# Patient Record
Sex: Male | Born: 1985 | Race: White | Hispanic: No | Marital: Married | Smoking: Never smoker
Health system: Southern US, Community
[De-identification: ages and names within clinical notes are randomized; demographics above are authoritative.]

## PROBLEM LIST (undated history)

## (undated) HISTORY — PX: HAND SURGERY: SHX662

## (undated) HISTORY — PX: APPENDECTOMY: SHX54

## (undated) HISTORY — PX: WISDOM TOOTH EXTRACTION: SHX21

## (undated) HISTORY — PX: MANDIBLE FRACTURE SURGERY: SHX706

---
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
## 2007-03-08 ENCOUNTER — Emergency Department (HOSPITAL_COMMUNITY): Admission: EM | Admit: 2007-03-08 | Discharge: 2007-03-09 | Payer: Self-pay | Admitting: Emergency Medicine

## 2011-02-25 NOTE — Op Note (Signed)
[facility]  Patient:    Earl [name], Earl [name] Visit Number: [id number] MRN: [id number]          Service Type: SUR Location: 4W [id number] [id number] Attending Physician:  Luis Abed Dictated by:   Dionne Ano. Gwyneth Sprout., D.D.S. Proc. Date: 09/03/01 Admit Date:  09/03/2001 Discharge Date: 09/04/2001                             Operative Report  PREOPERATIVE DIAGNOSES: 1. 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 2. 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 3. Mandibular transverse deficiency. 4. Maxillary transverse deficiency. 5. Mandibular asymmetry.  POSTOPERATIVE DIAGNOSES: 1. 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 2. 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 3. Mandibular transverse deficiency. 4. Maxillary transverse deficiency. 5. Mandibular asymmetry.  PROCEDURE: 1. LeFort I maxillary osteotomy with segmentalization and advancement. 2. Bilateral sagittal split osteotomy of the mandible with posterior repositioning and midline correction with rigid fixation.  SURGEON:  Dionne Ano. Gwyneth Sprout., D.D.S.  ASSISTANT:  Saddie Benders, D.D.S.  INDICATION FOR PROCEDURE:  This 25 year old male has been under orthodontic treatment with Dr. Randall An for a number of years for correction and alignment of the maxillary and mandibular dentition and is followed for a severe skeletal growth abnormality. He shows 9 mm of reverse overjut, and his occlusion cannot be corrected with orthodontic treatment. Complete preoperative x-rays, cephalometric analysis, model analysis, video imaging analysis, also all indicated severe discrepancy between the maxilla and mandible. Surgical models were completed and simulated surgery was completed on the models and surgical splints were fabricated prior to the surgery.  PROCEDURE AND FINDINGS:  The patient was brought to the operating suite  and placed in the supine position on the operating table. After satisfactory nasotracheal anesthesia, a warm moist sponge was placed in the oropharynx and the oral cavity and face were prepped with chlorhexidine scrub and rinse. The patient was then draped with four towels, a split sheet and a half sheet.  Prior to this, a Foley catheter was also inserted.  Using 0.5% Marcaine with 1:200,000 epinephrine, bilateral inferior alveolar and lingual nerve blocks were achieved for postoperative comfort and intraoperative hemostasis, and a mouth prop was placed on the left side. Using electrocautery, an incision was made along the external oblique ridge of the right ramus of the mandible extended to the second molar tooth and full thickness mucoperiosteal flap was elevated laterally exposing the anterior border of the ramus of the mandible and the internal oblique ridge. The lingula, using a rotary osteotome Lindemann burr, a horizontal band cut was made just superior to the lingula on the medial aspect of the mandible. A sagittal band cut was completed with a 701 burr and a bone cut through the inferior border and the lateral cortical plane of the mandible superiorly to connect with the sagittal cut in the modified _______ technique.  No attempt was made to complete the sagittal split at the present time and the mandible was left intact. A similar procedure was carried out on the left ramus of the mandible.  Turning to the maxilla, using  0.5% Marcaine with 1:200,000 epinephrine, bilateral posterior superior alveolar nerve blocks, inferior alveolar nerve blocks, and infraorbital nerve block, nasopalatine and greater palatine nerve blocks were achieved. Using electrocautery, an incision was made approximately 5 mm superior to the junction of the attached and unattached gingiva in the right maxillary buccal vestibule extending  from the tuberosity anteriorly to the midline. The incision was  carried through the mucosa, submucosal buccinator muscle, periosteum down to bone. The _______ periosteal dissection was carried out superiorly through the infraorbital nerve and piriform ridge of the nose and zygomatic buttress. A tunneling procedure was used on the posterior border of the maxilla to the pterygoid plates. A similar procedure was used on the left side.  Marking holes were placed in the lateral wall of the maxilla and both through the zygomatic buttress and the piriform rim area exactly 10 mm apart. Using a sagittal saw, a horizontal bone cut was made through the maxilla approximately 1 cm superior to the apices of the teeth from the zygomatic buttress to the piriform rim of the nose and then along the posterior border to the pterygoid plates. A mallet and sharp chisel were used to section the medial wall of the maxilla posteriorly to the cortical plates and a nasoseptal chisel was used to remove the nasal septum and vomer bone from the maxilla.  A pterygomaxillary chisel was used to section the maxilla from the pterygoid plate and the maxilla could easily be down-fractured at this point. This gave good access to the posterior wall of the maxilla and sharp bony protuberances were removed. The bone was smooth and being that the mandible was being advanced approximately 6 mm on the right side and 7 mm on the left side, no attempt was made to reduce any other bone structures. Both maxillary sinuses were clear. The maxilla was mobilized using Tessier disimpaction instruments and the maxilla could easily be advanced.  Care was taken to remove approximately 2 mm of the nasal septum in the anterior portion and the nasal mucosa was sutured closed. There was no tearing of the nasal mucosa from the down fracture.  The maxilla was then segmentalized using a 701 burr and the parasagittal area from the nasal side of the maxilla and extended anteriorly to the midline and a  horseshoe-shaped bone incision was made and 701 burr was used to cut through the lateral cortical plate between the central incisor teeth and a thin sharp  osteotome was used to split the maxilla into two segments. The palatine mucosa was undermined and the maxilla could easily be expanded 5 mm. As planned on the surgical models, the intermediate surgical splint was inserted and the teeth were ligated to the splint, and then the patient was placed in intermaxillary fixation and with the condyle seated in the most superior portion of the glenoid fossa, the maxilla was rotated superiorly and produced approximately 7 mm of advancement of the maxilla. Good bone contact was thus seen on both sides so that the piriform rim area and the zygomatic buttress area and bone plates were adapted using 1.6 mm titanium bone plates and shaped in a hull fashion. Bone plates were then inserted with the maxilla in its proper anatomical position and both the left and right piriform rims and both the left and right zygomatic buttresses. Intermaxillary fixation was removed. The occlusion was checked and it seemed to be exactly as up and on the surgical models, and the mandible rotated easily into the surgical splint.  The  maxillary surgical intermediate splint was removed and a final splint was placed and then attention was then carried out to both the ramus of the mandible. The bone cuts which had previously been made for the sagittal splint and using a thin mallet and chisel and progressing to larger chisels, the right maxillary  ramus sagittal splint was completed in the proximal and distal segments. The inferior alveolar nerve was noted to be located in the distal segment. The pterygomaxillary sling was removed from the inferior border of the mandible and this allowed posterior and rotational repositioning of the mandible. Assembly procedure was carried out on the left ramus of the mandible, also noting the  inferior alveolar nerve to be totally intact but located in the proximal segment and had to be removed and repositioned in the distal segment.  Then with the condyle seated in the most superior position over the glenoid fossa, using a vertical and horizontal directional force, the lateral cortical plate of the proximal segment was clamped to the medial cortical plate of the distal segment and titanium, 2-mm diameter, 12-mm length, screws were placed through the superior border, thus, allowing two-point fixation and stable fixation for the mandible.  The intermaxillary fixation was removed. The patients mandible was rotated in the glenoid fossa and fit exactly into the surgical splint indicating achievement of a Class I occlusive relationship with good midline alignment.  All the incisions were thoroughly irrigated. The maxilla was closed with an alar cinch suture and VY closure in the anterior area to prevent inversion of the lip after healing. Both incisions of the mandible were closed with running horizontal 3-0 Vicryl mattress sutures.  The throat pack was removed. The oropharynx was suctioned dry. The patient was placed in light intermaxillary elastic fixation and allowed to recover in the operating room. He was extubated in the operating room and transported to the recovery room.  The patient tolerated this surgery and the anesthesia without complications with an estimated blood loss of less than 200 cc.  COMPLICATIONS:  None. Dictated by:   Dionne Ano. Gwyneth Sprout., D.D.S. Attending Physician:  Luis Abed DD:  09/03/01 TD:  09/04/01 Job: [id number]

## 2011-02-25 NOTE — Discharge Summary (Signed)
[facility]  Patient:    Earl [name], Earl [name] Visit Number: [id number] MRN: [id number]          Service Type: Attending:  Dionne Ano. Gwyneth Sprout., D.D.S. Dictated by:   Dionne Ano. Gwyneth Sprout., D.D.S. Adm. Date:  09/03/01 Disc. Date: 09/04/01                             Discharge Summary  This 25 year old male was admitted to [facility] with a primary diagnosis of maxillary sagittal deficiency, maxillary transverse deficiency, mandibular sagittal excess with class III malocclusion, and mandibular asymmetry, and was admitted for surgical correction of these facial bony abnormalities.  CHIEF COMPLAINT:  He could not chew food well, and he has a large underbite.  HISTORY OF PRESENT ILLNESS:  This 25 year old male has been under orthodontic care for approximately 4 years with Dr. Lorin Picket ______ for alignment of the maxillary mandibular teeth, and following his facial skeletal growth pattern. Cephametric x-ray, ______ analysis, clinical analysis, video imaging was all completed on this patient showing that he had significant maxillary transverse deficiency, maxillary sagittal deficiency, and mandibular sagittal excess with asymmetry.  Correction of his malocclusion was impossible with ______ means, and the teeth were aligned, and each individual arch in their proper anatomical position for surgical repositioning of the maxilla.  Surgical treatment plan was formulated on surgical models with template guides for correction of these facial abnormalities, and the patient was admitted for the surgical procedure.  PAST MEDICAL HISTORY:  Unremarkable.  ADMISSION LABORATORY DATA:  Hemoglobin was 14.7, hematocrit 43.7, normal indices.  He was typed as type O, Rh factor was positive, and antibody screen was negative.  SURGERY:  The patient was taken to surgery on the day of admission, and under general nasotracheal anesthesia a segmental two piece LaFort I  maxillary osteotomy with advancement and transverse aligning with rigid fixation was completed, along with a bilateral sagittal split osteotomy of the mandible with posterior repositioning and rotational correction of the asymmetry with rigid fixation.  The patient tolerated this surgery and the anesthesia without complications, with an estimated blood loss of approximately 200 cc.  HOSPITAL COURSE:  The first postoperative day was marked by decrease of oral intake, and the patient was maintained on intravenous fluids, and he is now taking oral fluids well, and is ready for discharge.  He has moderate facial swelling and no airway obstruction, and home care instructions have been reviewed with the patient and his father, including instructions on dietary intake for a full liquid and mechanical soft diet.  How to remove the maxillary mandibular fixation should emesis and airway obstruction occur. Oral hygiene.  DISCHARGE MEDICATIONS: 1. Keflex 500 mg one tablet q.i.d. 2. Decadron 4 mg one tablet t.i.d. x 3 days. 3. Percocet one tablet q.4h. p.r.n. pain.  FINAL DIAGNOSES: 1. 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 2. Maxillary transverse deficiency. 3. 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 4. 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 Dictated by:   Dionne Ano. Gwyneth Sprout., D.D.S. Attending:  Dionne Ano. Gwyneth Sprout., D.D.S. DD:  09/04/01 TD:  09/04/01 Job: [id number]

## 2012-05-22 ENCOUNTER — Emergency Department (HOSPITAL_COMMUNITY)
Admission: EM | Admit: 2012-05-22 | Discharge: 2012-05-22 | Disposition: A | Discharge status: Home / Self Care | Payer: Managed Care, Other (non HMO) | Attending: Emergency Medicine | Admitting: Emergency Medicine

## 2012-05-22 ENCOUNTER — Encounter (HOSPITAL_COMMUNITY): Payer: Self-pay

## 2012-05-22 DIAGNOSIS — R509 Fever, unspecified: Secondary | ICD-10-CM | POA: Insufficient documentation

## 2012-05-22 DIAGNOSIS — R42 Dizziness and giddiness: Secondary | ICD-10-CM | POA: Insufficient documentation

## 2012-05-22 DIAGNOSIS — J029 Acute pharyngitis, unspecified: Secondary | ICD-10-CM | POA: Insufficient documentation

## 2012-05-22 LAB — CBC WITH DIFFERENTIAL/PLATELET
Basophils Absolute: 0 10*3/uL (ref 0.0–0.1)
Basophils Relative: 0 % (ref 0–1)
Eosinophils Absolute: 0 10*3/uL (ref 0.0–0.7)
Eosinophils Relative: 0 % (ref 0–5)
HCT: 39.4 % (ref 39.0–52.0)
MCH: 28.9 pg (ref 26.0–34.0)
MCHC: 34 g/dL (ref 30.0–36.0)
MCV: 84.9 fL (ref 78.0–100.0)
Monocytes Absolute: 0.9 10*3/uL (ref 0.1–1.0)
Platelets: 216 10*3/uL (ref 150–400)
RDW: 12.8 % (ref 11.5–15.5)

## 2012-05-22 LAB — MONONUCLEOSIS SCREEN: Mono Screen: NEGATIVE

## 2012-05-22 MED ORDER — DEXAMETHASONE SODIUM PHOSPHATE 10 MG/ML IJ SOLN
10.0000 mg | Freq: Once | INTRAMUSCULAR | Status: AC
  Administered 2012-05-22: 10 mg via INTRAVENOUS
  Filled 2012-05-22: qty 1

## 2012-05-22 MED ORDER — SODIUM CHLORIDE 0.9 % IV SOLN
Freq: Once | INTRAVENOUS | Status: AC
  Administered 2012-05-22: 20:00:00 via INTRAVENOUS

## 2012-05-22 MED ORDER — IBUPROFEN 800 MG PO TABS
800.0000 mg | ORAL_TABLET | Freq: Once | ORAL | Status: AC
Start: 2012-05-22 — End: 2012-05-22
  Administered 2012-05-22: 800 mg via ORAL
  Filled 2012-05-22: qty 1

## 2012-05-22 MED ORDER — PREDNISONE 10 MG PO TABS: 20.0000 mg | ORAL_TABLET | Freq: Two times a day (BID) | ORAL | Status: DC

## 2012-05-22 NOTE — ED Provider Notes (Signed)
History     CSN: [id number]  Arrival date & time 05/22/12  1758   First MD Initiated Contact with Patient 05/22/12 1854      Chief Complaint  Patient presents with  . Fever    (Consider location/radiation/quality/duration/timing/severity/associated sxs/prior treatment) HPI Comments: Patient with several day history of sore throat, fatigue, fever.  Was seen yesterday at [facility] and given amox for presumed strep.  He states that he is feeling worse and is now light headed.  He continues with fever.  Patient is a 26 y.o. male presenting with fever. The history is provided by the patient.  Fever Primary symptoms of the febrile illness include fever and fatigue. The current episode started 3 to 5 days ago. The problem has been gradually worsening.    History reviewed. No pertinent past medical history.  Past Surgical History  Procedure Date  . Mandible fracture surgery   . Hand surgery   . Appendectomy   . Wisdom tooth extraction     No family history on file.  History  Substance Use Topics  . Smoking status: Never Smoker   . Smokeless tobacco: Not on file  . Alcohol Use: No      Review of Systems  Constitutional: Positive for fever and fatigue.  All other systems reviewed and are negative.    Allergies  Review of patient's allergies indicates no known allergies.  Home Medications  No current outpatient prescriptions on file.  BP 133/78  Pulse 98  Temp 100.4 F (38 C)  Resp 20  Ht 6\' 1"  (1.854 m)  Wt 206 lb 11.2 oz (93.759 kg)  BMI 27.27 kg/m2  SpO2 97%  Physical Exam  Nursing note and vitals reviewed. Constitutional: He is oriented to person, place, and time. He appears well-developed and well-nourished. No distress.  HENT:  Head: Normocephalic and atraumatic.  Right Ear: External ear normal.  Left Ear: External ear normal.       Both tonsils are swollen, erythematous, and have exudates.    There is cervical adenopathy present.  Neck: Normal range of  motion. Neck supple.  Cardiovascular: Normal rate and regular rhythm.   No murmur heard. Pulmonary/Chest: Effort normal and breath sounds normal. No respiratory distress.  Abdominal: Soft. Bowel sounds are normal.  Musculoskeletal: Normal range of motion. He exhibits no edema.  Lymphadenopathy:    He has cervical adenopathy.  Neurological: He is alert and oriented to person, place, and time.  Skin: Skin is warm and dry. He is not diaphoretic.    ED Course  Procedures (including critical care time)   Labs Reviewed  CBC WITH DIFFERENTIAL  MONONUCLEOSIS SCREEN  RAPID STREP SCREEN   No results found.   No diagnosis found.    MDM  The patient presents with sore throat, fever, weakness.  He was treated for presumed strep throat with amoxicillin yesterday and has had four doses.  Today's labs are unremarkable.  Both the Strep and Mono tests were unremarkable and the cbc is as well.  He was hydrated with ns and given decadron.  He is feeling better and will be discharged with the instructions to continue the amox and start prednisone.          Geoffery Lyons, MD 05/22/12 2046

## 2012-05-22 NOTE — ED Notes (Signed)
Pt reports was diagnosed with strep throat yesterday and was started on antibiotics.  Pt reports fever got up to 103 today and pt c/o feeling dizzy and nauseated.

## 2013-01-01 ENCOUNTER — Ambulatory Visit (INDEPENDENT_AMBULATORY_CARE_PROVIDER_SITE_OTHER): Payer: Managed Care, Other (non HMO) | Admitting: Gastroenterology

## 2013-01-01 ENCOUNTER — Encounter: Payer: Self-pay | Admitting: Gastroenterology

## 2013-01-01 ENCOUNTER — Other Ambulatory Visit: Payer: Self-pay | Admitting: Internal Medicine

## 2013-01-01 VITALS — BP 128/77 | HR 75 | Temp 97.6°F | Ht 73.0 in | Wt 213.4 lb

## 2013-01-01 DIAGNOSIS — K6289 Other specified diseases of anus and rectum: Secondary | ICD-10-CM

## 2013-01-01 DIAGNOSIS — R198 Other specified symptoms and signs involving the digestive system and abdomen: Secondary | ICD-10-CM

## 2013-01-01 DIAGNOSIS — K625 Hemorrhage of anus and rectum: Secondary | ICD-10-CM

## 2013-01-01 DIAGNOSIS — R194 Change in bowel habit: Secondary | ICD-10-CM

## 2013-01-01 MED ORDER — PEG 3350-KCL-NA BICARB-NACL 420 G PO SOLR: 4000.0000 mL | ORAL | Status: DC

## 2013-01-01 NOTE — Assessment & Plan Note (Signed)
Rectal bleeding of 4 months duration, associated with change in bowel habits, rectal pain. Patient states that bleeding is large volume consisting of both dark and bright red blood per rectum. Denies melena. Denies NSAID or aspirin use. Stool is thinner than usual and he hass developed postprandial urgency. He may have bleeding from anorectal fissure but we need to exclude inflammatory bowel disease given change in bowel habits. He has been advised to keep the stool soft and if needed he can add MiraLax 17 g daily. He'll complete his current regimen of Anusol suppositories. If he has recurrent pain he is to let me know and we will refill his prescription. Colonoscopy in the near future.  I have discussed the risks, alternatives, benefits with regards to but not limited to the risk of reaction to medication, bleeding, infection, perforation and the patient is agreeable to proceed. Written consent to be obtained.

## 2013-01-01 NOTE — Patient Instructions (Signed)
We have scheduled you for a colonoscopy with Dr. Jena Gauss to further evaluate your symptoms. Please see separate instructions.  Continue stool softener daily to maintain soft stools. If you have recurrent rectal pain, please call and let me know so we can refill your Anusol suppositories.

## 2013-01-01 NOTE — Progress Notes (Signed)
Primary Care Physician:  FAGAN,ROY, MD  Primary Gastroenterologist:  Michael Rourk, MD   Chief Complaint  Patient presents with  . Rectal Bleeding  . Rectal Pain    HPI:  Earl [name] is a 26 y.o. male here for further evaluation of several episodes of rectal bleeding. First episode occurred around November of last year. He's had recurrent bleeding in the last few weeks. Each time he has dark and bright red blood, described as large volume. Initially he did not have any rectal pain associated with this but as the episodes progressed he had more pain. Really denies any significant constipation. He generally would have one bowel movement daily up until more recently when he's noticed postprandial fecal urgency. His stool has become thin. Generally has a couple of bowel movements in the late afternoon and early evening. On rectal exam by Dr. Fagan he had tenderness at 6:00 but no palpable mass. No gross blood present. Hemoccult faintly positive. He had a rectal tag.  Some crampy abdominal pain. No heartburn, n/v, dysphagia, weight loss. Started anusol suppositories about 2 weeks ago. Rectal pain has improved but he continues to have bleeding. He is on a stool softener daily and denies hard stool or straining.  No family history of inflammatory bowel disease, colon cancer. Grandfather has history of diverticulitis.    Current Outpatient Prescriptions  Medication Sig Dispense Refill  . ANUSOL-HC 25 MG suppository Place 25 mg rectally 2 (two) times daily.       . docusate sodium (COLACE) 100 MG capsule Take 100 mg by mouth 2 (two) times daily.      . polyethylene glycol-electrolytes (TRILYTE) 420 G solution Take 4,000 mLs by mouth as directed.  4000 mL  0   No current facility-administered medications for this visit.    Allergies as of 01/01/2013  . (No Known Allergies)    History reviewed. No pertinent past medical history.  Past Surgical History  Procedure Laterality Date  .  Mandible fracture surgery    . Hand surgery    . Appendectomy    . Wisdom tooth extraction      Family History  Problem Relation Age of Onset  . Diverticulitis Maternal Grandfather     History   Social History  . Marital Status: Married    Spouse Name: N/A    Number of Children: 1  . Years of Education: N/A   Occupational History  . [employer]     production scheduler   Social History Main Topics  . Smoking status: Never Smoker   . Smokeless tobacco: Not on file  . Alcohol Use: Yes     Comment: socially, couple of times per month  . Drug Use: No  . Sexually Active: Not on file   Other Topics Concern  . Not on file   Social History Narrative  . No narrative on file      ROS:  General: Negative for anorexia, weight loss, fever, chills, fatigue, weakness. Eyes: Negative for vision changes.  ENT: Negative for hoarseness, difficulty swallowing , nasal congestion. CV: Negative for chest pain, angina, palpitations, dyspnea on exertion, peripheral edema.  Respiratory: Negative for dyspnea at rest, dyspnea on exertion, cough, sputum, wheezing.  GI: See history of present illness. GU:  Negative for dysuria, hematuria, urinary incontinence, urinary frequency, nocturnal urination.  MS: Negative for joint pain, low back pain.  Derm: Negative for rash or itching.  Neuro: Negative for weakness, abnormal sensation, seizure, frequent headaches, memory loss, confusion.    Psych: Negative for anxiety, depression, suicidal ideation, hallucinations.  Endo: Negative for unusual weight change.  Heme: Negative for bruising or bleeding. Allergy: Negative for rash or hives.    Physical Examination:  BP 128/77  Pulse 75  Temp(Src) 97.6 F (36.4 C) (Oral)  Ht 6' 1" (1.854 m)  Wt 213 lb 6.4 oz (96.798 kg)  BMI 28.16 kg/m2   General: Well-nourished, well-developed in no acute distress.  Head: Normocephalic, atraumatic.   Eyes: Conjunctiva pink, no icterus. Mouth: Oropharyngeal  mucosa moist and pink , no lesions erythema or exudate. Neck: Supple without thyromegaly, masses, or lymphadenopathy.  Lungs: Clear to auscultation bilaterally.  Heart: Regular rate and rhythm, no murmurs rubs or gallops.  Abdomen: Bowel sounds are normal, nontender, nondistended, no hepatosplenomegaly or masses, no abdominal bruits or hernia , no rebound or guarding.   Rectal: Deferred. Extremities: No lower extremity edema. No clubbing or deformities.  Neuro: Alert and oriented x 4 , grossly normal neurologically.  Skin: Warm and dry, no rash or jaundice.   Psych: Alert and cooperative, normal mood and affect.  Labs: Patient reports that recent CBC with Dr. Fagan was normal.  Imaging Studies: No results found.    

## 2013-01-01 NOTE — Progress Notes (Signed)
Faxed to PCP

## 2013-01-02 ENCOUNTER — Encounter (HOSPITAL_COMMUNITY): Payer: Self-pay | Admitting: Pharmacy Technician

## 2013-01-09 ENCOUNTER — Ambulatory Visit (HOSPITAL_COMMUNITY)
Admission: RE | Admit: 2013-01-09 | Discharge: 2013-01-09 | Disposition: A | Discharge status: Home / Self Care | Payer: Managed Care, Other (non HMO) | Source: Ambulatory Visit | Attending: Internal Medicine | Admitting: Internal Medicine

## 2013-01-09 ENCOUNTER — Encounter (HOSPITAL_COMMUNITY)
Admission: RE | Disposition: A | Discharge status: Home / Self Care | Payer: Self-pay | Source: Ambulatory Visit | Attending: Internal Medicine

## 2013-01-09 ENCOUNTER — Encounter (HOSPITAL_COMMUNITY): Payer: Self-pay | Admitting: *Deleted

## 2013-01-09 DIAGNOSIS — K6289 Other specified diseases of anus and rectum: Secondary | ICD-10-CM

## 2013-01-09 DIAGNOSIS — K648 Other hemorrhoids: Secondary | ICD-10-CM | POA: Insufficient documentation

## 2013-01-09 DIAGNOSIS — K625 Hemorrhage of anus and rectum: Secondary | ICD-10-CM

## 2013-01-09 DIAGNOSIS — R194 Change in bowel habit: Secondary | ICD-10-CM

## 2013-01-09 HISTORY — PX: COLONOSCOPY: SHX5424

## 2013-01-09 SURGERY — COLONOSCOPY
Anesthesia: Moderate Sedation

## 2013-01-09 MED ORDER — MEPERIDINE HCL 100 MG/ML IJ SOLN
INTRAMUSCULAR | Status: AC
  Filled 2013-01-09: qty 2

## 2013-01-09 MED ORDER — MIDAZOLAM HCL 5 MG/5ML IJ SOLN
INTRAMUSCULAR | Status: AC
  Filled 2013-01-09: qty 10

## 2013-01-09 MED ORDER — ONDANSETRON HCL 4 MG/2ML IJ SOLN
INTRAMUSCULAR | Status: AC
  Filled 2013-01-09: qty 2

## 2013-01-09 MED ORDER — MIDAZOLAM HCL 5 MG/5ML IJ SOLN
INTRAMUSCULAR | Status: DC | PRN
  Administered 2013-01-09 (×3): 2 mg via INTRAVENOUS
  Administered 2013-01-09: 1 mg via INTRAVENOUS

## 2013-01-09 MED ORDER — MEPERIDINE HCL 100 MG/ML IJ SOLN
INTRAMUSCULAR | Status: DC | PRN
  Administered 2013-01-09 (×3): 50 mg via INTRAVENOUS

## 2013-01-09 MED ORDER — STERILE WATER FOR IRRIGATION IR SOLN
Status: DC | PRN
  Administered 2013-01-09: 12:00:00

## 2013-01-09 MED ORDER — SODIUM CHLORIDE 0.9 % IV SOLN
INTRAVENOUS | Status: DC
  Administered 2013-01-09: 1000 mL via INTRAVENOUS

## 2013-01-09 MED ORDER — ONDANSETRON HCL 4 MG/2ML IJ SOLN
INTRAMUSCULAR | Status: DC | PRN
  Administered 2013-01-09: 4 mg via INTRAVENOUS

## 2013-01-09 NOTE — Op Note (Signed)
[facility] [address] [place], [address]   COLONOSCOPY PROCEDURE REPORT  PATIENT: [name], Earl T.  MR#:         [id number] BIRTHDATE: 21-Nov-1985 , 26  yrs. old GENDER: Male ENDOSCOPIST: R.  Roetta Sessions, MD FACP FACG REFERRED BY:  Carylon Perches, M.D. PROCEDURE DATE:  01/09/2013 PROCEDURE:     Ileocolonoscopy-diagnostic  INDICATIONS: Rectal pain and bleeding  INFORMED CONSENT:  The risks, benefits, alternatives and imponderables including but not limited to bleeding, perforation as well as the possibility of a missed lesion have been reviewed.  The potential for biopsy, lesion removal, etc. have also been discussed.  Questions have been answered.  All parties agreeable. Please see the history and physical in the medical record for more information.  MEDICATIONS: Versed 7 mg IV and Demerol 150 mg IV in divided doses. Zofran 4 mg IV  DESCRIPTION OF PROCEDURE:  After a digital rectal exam was performed, the EC-[id number] ([id number])  colonoscope was advanced from the anus through the rectum and colon to the area of the cecum, ileocecal valve and appendiceal orifice.  The cecum was deeply intubated.  These structures were well-seen and photographed for the record.  From the level of the cecum and ileocecal valve, the scope was slowly and cautiously withdrawn.  The mucosal surfaces were carefully surveyed utilizing scope tip deflection to facilitate fold flattening as needed.  The scope was pulled down into the rectum where a thorough examination including retroflexion was performed.    FINDINGS:  Adequate preparation. Single anal papilla and minimal internal hemorrhoids; otherwise, normal rectal mucosa. Normal-appearing colonic mucosa. Normal distal 10 cm of terminal ileal mucosa.  THERAPEUTIC / DIAGNOSTIC MANEUVERS PERFORMED:  None  COMPLICATIONS: none  CECAL WITHDRAWAL TIME:  8 minutes  IMPRESSION:  Minimal internal hemorrhoids and anal  papilla/normal rectum, colon and terminal ileum. I suspect ano-rectal bleeding and proctalgia secondary to an occult anal fissure.  RECOMMENDATIONS: Benefiber 2 tablespoons daily. Continue Colace 100 mg orally twice daily.   _______________________________ eSigned:  R. Roetta Sessions, MD FACP [facility] 01/09/2013 12:37 PM   CC:

## 2013-01-09 NOTE — H&P (View-Only) (Signed)
Primary Care Physician:  Carylon Perches, MD  Primary Gastroenterologist:  Roetta Sessions, MD   Chief Complaint  Patient presents with  . Rectal Bleeding  . Rectal Pain    HPI:  Earl [name] is a 27 y.o. male here for further evaluation of several episodes of rectal bleeding. First episode occurred around November of last year. He's had recurrent bleeding in the last few weeks. Each time he has dark and bright red blood, described as large volume. Initially he did not have any rectal pain associated with this but as the episodes progressed he had more pain. Really denies any significant constipation. He generally would have one bowel movement daily up until more recently when he's noticed postprandial fecal urgency. His stool has become thin. Generally has a couple of bowel movements in the late afternoon and early evening. On rectal exam by Dr. Ouida Sills he had tenderness at 6:00 but no palpable mass. No gross blood present. Hemoccult faintly positive. He had a rectal tag.  Some crampy abdominal pain. No heartburn, n/v, dysphagia, weight loss. Started anusol suppositories about 2 weeks ago. Rectal pain has improved but he continues to have bleeding. He is on a stool softener daily and denies hard stool or straining.  No family history of inflammatory bowel disease, colon cancer. Grandfather has history of diverticulitis.    Current Outpatient Prescriptions  Medication Sig Dispense Refill  . ANUSOL-HC 25 MG suppository Place 25 mg rectally 2 (two) times daily.       Marland Kitchen docusate sodium (COLACE) 100 MG capsule Take 100 mg by mouth 2 (two) times daily.      . polyethylene glycol-electrolytes (TRILYTE) 420 G solution Take 4,000 mLs by mouth as directed.  4000 mL  0   No current facility-administered medications for this visit.    Allergies as of 01/01/2013  . (No Known Allergies)    History reviewed. No pertinent past medical history.  Past Surgical History  Procedure Laterality Date  .  Mandible fracture surgery    . Hand surgery    . Appendectomy    . Wisdom tooth extraction      Family History  Problem Relation Age of Onset  . Diverticulitis Maternal Grandfather     History   Social History  . Marital Status: Married    Spouse Name: N/A    Number of Children: 1  . Years of Education: N/A   Occupational History  . [employer]   Social History Main Topics  . Smoking status: Never Smoker   . Smokeless tobacco: Not on file  . Alcohol Use: Yes     Comment: socially, couple of times per month  . Drug Use: No  . Sexually Active: Not on file   Other Topics Concern  . Not on file   Social History Narrative  . No narrative on file      ROS:  General: Negative for anorexia, weight loss, fever, chills, fatigue, weakness. Eyes: Negative for vision changes.  ENT: Negative for hoarseness, difficulty swallowing , nasal congestion. CV: Negative for chest pain, angina, palpitations, dyspnea on exertion, peripheral edema.  Respiratory: Negative for dyspnea at rest, dyspnea on exertion, cough, sputum, wheezing.  GI: See history of present illness. GU:  Negative for dysuria, hematuria, urinary incontinence, urinary frequency, nocturnal urination.  MS: Negative for joint pain, low back pain.  Derm: Negative for rash or itching.  Neuro: Negative for weakness, abnormal sensation, seizure, frequent headaches, memory loss, confusion.  Psych: Negative for anxiety, depression, suicidal ideation, hallucinations.  Endo: Negative for unusual weight change.  Heme: Negative for bruising or bleeding. Allergy: Negative for rash or hives.    Physical Examination:  BP 128/77  Pulse 75  Temp(Src) 97.6 F (36.4 C) (Oral)  Ht 6\' 1"  (1.854 m)  Wt 213 lb 6.4 oz (96.798 kg)  BMI 28.16 kg/m2   General: Well-nourished, well-developed in no acute distress.  Head: Normocephalic, atraumatic.   Eyes: Conjunctiva pink, no icterus. Mouth: Oropharyngeal  mucosa moist and pink , no lesions erythema or exudate. Neck: Supple without thyromegaly, masses, or lymphadenopathy.  Lungs: Clear to auscultation bilaterally.  Heart: Regular rate and rhythm, no murmurs rubs or gallops.  Abdomen: Bowel sounds are normal, nontender, nondistended, no hepatosplenomegaly or masses, no abdominal bruits or hernia , no rebound or guarding.   Rectal: Deferred. Extremities: No lower extremity edema. No clubbing or deformities.  Neuro: Alert and oriented x 4 , grossly normal neurologically.  Skin: Warm and dry, no rash or jaundice.   Psych: Alert and cooperative, normal mood and affect.  Labs: Patient reports that recent CBC with Dr. Ouida Sills was normal.  Imaging Studies: No results found.

## 2013-01-09 NOTE — Interval H&P Note (Signed)
History and Physical Interval Note:  01/09/2013 11:59 AM  Earl [name]  has presented today for surgery, with the diagnosis of RECTAL BLEEDING, RECTAL PAIN AND CHANGE IN BOWEL HABITS  The various methods of treatment have been discussed with the patient and family. After consideration of risks, benefits and other options for treatment, the patient has consented to  Procedure(s) with comments: COLONOSCOPY (N/A) - 11:30 as a surgical intervention .  The patient's history has been reviewed, patient examined, no change in status, stable for surgery.  I have reviewed the patient's chart and labs.  Questions were answered to the patient's satisfaction.     Eula Listen  Colonoscopy per plan.The risks, benefits, limitations, alternatives and imponderables have been reviewed with the patient. Questions have been answered. All parties are agreeable.

## 2013-01-14 ENCOUNTER — Encounter (HOSPITAL_COMMUNITY): Payer: Self-pay | Admitting: Internal Medicine

## 2013-02-04 ENCOUNTER — Encounter: Payer: Self-pay | Admitting: Internal Medicine

## 2013-02-06 ENCOUNTER — Ambulatory Visit (INDEPENDENT_AMBULATORY_CARE_PROVIDER_SITE_OTHER): Payer: Managed Care, Other (non HMO) | Admitting: Gastroenterology

## 2013-02-06 ENCOUNTER — Encounter: Payer: Self-pay | Admitting: Gastroenterology

## 2013-02-06 VITALS — BP 135/78 | HR 72 | Temp 98.2°F | Ht 73.0 in | Wt 213.0 lb

## 2013-02-06 DIAGNOSIS — K602 Anal fissure, unspecified: Secondary | ICD-10-CM

## 2013-02-06 DIAGNOSIS — K649 Unspecified hemorrhoids: Secondary | ICD-10-CM | POA: Insufficient documentation

## 2013-02-06 NOTE — Progress Notes (Signed)
Cc PCP 

## 2013-02-06 NOTE — Patient Instructions (Addendum)
Please call if you have recurrent symptoms of bleeding or pain.

## 2013-02-06 NOTE — Progress Notes (Signed)
Primary Care Physician: Carylon Perches, MD  Primary Gastroenterologist:  Roetta Sessions, MD   Chief Complaint  Patient presents with  . Follow-up    HPI: Earl [name] is a 27 y.o. male here for followup of recent colonoscopy. Colonoscopy was performed due to history of anorectal pain, bleeding. He was found to have minimal internal hemorrhoids and anal papilla. It was felt that he likely had a cold anorectal fissure to explain his bleeding and pain.  At this time he feels 90% improved. Rarely has any anorectal pain or bleeding. He has been able to manage his bowels with Colace 100 mg twice a day. He is having soft daily stools. No further straining. Denies abdominal pain. He is pleased with his progress.  Current Outpatient Prescriptions  Medication Sig Dispense Refill  . docusate sodium (COLACE) 100 MG capsule Take 100 mg by mouth 2 (two) times daily.       No current facility-administered medications for this visit.    Allergies as of 02/06/2013  . (No Known Allergies)    ROS:  General: Negative for anorexia, weight loss, fever, chills, fatigue, weakness. ENT: Negative for hoarseness, difficulty swallowing , nasal congestion. CV: Negative for chest pain, angina, palpitations, dyspnea on exertion, peripheral edema.  Respiratory: Negative for dyspnea at rest, dyspnea on exertion, cough, sputum, wheezing.  GI: See history of present illness. GU:  Negative for dysuria, hematuria, urinary incontinence, urinary frequency, nocturnal urination.  Endo: Negative for unusual weight change.    Physical Examination:   BP 135/78  Pulse 72  Temp(Src) 98.2 F (36.8 C) (Oral)  Ht 6\' 1"  (1.854 m)  Wt 213 lb (96.616 kg)  BMI 28.11 kg/m2  General: Well-nourished, well-developed in no acute distress.  Eyes: No icterus. Mouth: Oropharyngeal mucosa moist and pink , no lesions erythema or exudate. Lungs: Clear to auscultation bilaterally.  Heart: Regular rate and rhythm, no murmurs rubs or  gallops.  Abdomen: Bowel sounds are normal, nontender, nondistended, no hepatosplenomegaly or masses, no abdominal bruits or hernia , no rebound or guarding.   Extremities: No lower extremity edema. No clubbing or deformities. Neuro: Alert and oriented x 4   Skin: Warm and dry, no jaundice.   Psych: Alert and cooperative, normal mood and affect.

## 2013-02-06 NOTE — Assessment & Plan Note (Signed)
27 year old with recent proctalgia and anorectal bleeding related to likely occult fissure. Also found to have minimal internal hemorrhoids. He is 90% improved. He will continue to maintain soft BMs. If he develops persistent/recurrent anorectal pain would consider trial of Rectiv as need step in management. OV prn.

## 2013-03-30 NOTE — Progress Notes (Signed)
REVIEWED.  

## 2016-10-23 ENCOUNTER — Other Ambulatory Visit (HOSPITAL_BASED_OUTPATIENT_CLINIC_OR_DEPARTMENT_OTHER): Payer: Self-pay

## 2016-10-23 DIAGNOSIS — R0683 Snoring: Secondary | ICD-10-CM

## 2016-10-23 DIAGNOSIS — G4730 Sleep apnea, unspecified: Secondary | ICD-10-CM

## 2016-10-31 ENCOUNTER — Ambulatory Visit: Payer: 59 | Attending: Internal Medicine | Admitting: Neurology

## 2016-10-31 DIAGNOSIS — R0683 Snoring: Secondary | ICD-10-CM | POA: Diagnosis present

## 2016-10-31 DIAGNOSIS — G4761 Periodic limb movement disorder: Secondary | ICD-10-CM | POA: Diagnosis not present

## 2016-10-31 DIAGNOSIS — G4733 Obstructive sleep apnea (adult) (pediatric): Secondary | ICD-10-CM | POA: Insufficient documentation

## 2016-10-31 DIAGNOSIS — G4730 Sleep apnea, unspecified: Secondary | ICD-10-CM

## 2016-11-04 NOTE — Procedures (Signed)
  [facility] Bynum Mccullars A. Gerilyn Pilgrimoonquah, MD     [email or web address]             NOCTURNAL POLYSOMNOGRAPHY   LOCATION: [facility]  Patient Name: Muscatello, [place] Study Date: 10/31/2016 Gender: Male D.O.B: 04/06/1986 Age (years): 30 Referring Provider: Carylon Perchesoy Fagan Height (inches): 73 Interpreting Physician: Beryle BeamsKofi Jomo Forand MD, ABSM Weight (lbs): 238 RPSGT: Alfonso EllisHedrick, Debra BMI: 31 MRN: [id number] Neck Size: 16.50 CLINICAL INFORMATION Sleep Study Type: NPSG  Indication for sleep study: Snoring  Epworth Sleepiness Score: 10  SLEEP STUDY TECHNIQUE As per the AASM Manual for the Scoring of Sleep and Associated Events v2.3 (April 2016) with a hypopnea requiring 4% desaturations.  The channels recorded and monitored were frontal, central and occipital EEG, electrooculogram (EOG), submentalis EMG (chin), nasal and oral airflow, thoracic and abdominal wall motion, anterior tibialis EMG, snore microphone, electrocardiogram, and pulse oximetry.  MEDICATIONS Medications self-administered by patient taken the night of the study : N/A  Current Outpatient Prescriptions:  .  docusate sodium (COLACE) 100 MG capsule, Take 100 mg by mouth 2 (two) times daily., Disp: , Rfl:    SLEEP ARCHITECTURE The study was initiated at 9:37:37 PM and ended at 4:29:26 AM.  Sleep onset time was 77.2 minutes and the sleep efficiency was 77.8%. The total sleep time was 320.6 minutes.  Stage REM latency was 60.0 minutes.  The patient spent 6.55% of the night in stage N1 sleep, 59.11% in stage N2 sleep, 16.25% in stage N3 and 18.09% in REM.  Alpha intrusion was absent.  Supine sleep was 44.20%.  RESPIRATORY PARAMETERS The overall apnea/hypopnea index (AHI) was 17.0 per hour. There were 46 total apneas, including 21 obstructive, 12 central and 13 mixed apneas. There were 45 hypopneas and 0 RERAs.  The AHI during Stage REM sleep was 13.4 per hour.  AHI while supine was 34.3 per hour.  The mean oxygen  saturation was 94.90%. The minimum SpO2 during sleep was 90.00%.  Moderate snoring was noted during this study.  CARDIAC DATA The 2 lead EKG demonstrated sinus rhythm. The mean heart rate was N/A beats per minute. Other EKG findings include: None. LEG MOVEMENT DATA The total PLMS were 46 with a resulting PLMS index of 8.61. Associated arousal with leg movement index was 3.6.  IMPRESSIONS - Moderate obstructive sleep apnea is observed. CPAP titration is suggested.  - Mild periodic limb movements of sleep occurred during the study. No significant associated arousals.   Argie RammingKofi A Henslee Lottman, MD Diplomate, American Board of Sleep Medicine.

## 2017-01-20 ENCOUNTER — Encounter: Payer: Self-pay | Admitting: Pulmonary Disease

## 2017-01-20 ENCOUNTER — Ambulatory Visit (INDEPENDENT_AMBULATORY_CARE_PROVIDER_SITE_OTHER): Payer: 59 | Admitting: Pulmonary Disease

## 2017-01-20 DIAGNOSIS — G4733 Obstructive sleep apnea (adult) (pediatric): Secondary | ICD-10-CM | POA: Diagnosis not present

## 2017-01-20 NOTE — Assessment & Plan Note (Signed)
Schedule a CPAP titration study at [facility]. Based on this we will set you up with a CPAP machine   The pathophysiology of obstructive sleep apnea , it's cardiovascular consequences & modes of treatment including CPAP were discused with the patient in detail & they evidenced understanding.

## 2017-01-20 NOTE — Patient Instructions (Signed)
Schedule a CPAP titration study at [facility]. Based on this we will set you up with a CPAP machine

## 2017-01-20 NOTE — Progress Notes (Signed)
Subjective:    Patient ID: Earl [name], male    DOB: April 04, 1986, 31 y.o.   MRN: [id number]  HPI  Chief Complaint  Patient presents with  . Sleep Consult    for OSA. Pt has already has a SS.    31 year old presents for evaluation of obstructive sleep apnea. He reports trouble staying asleep at night's, frequent nocturnal awakenings and restless sleep. Symptoms have been ongoing for the last 2 years He reports non-refreshing sleep in spite of adequate sleep quantity about 8 hours per night. He has tried over-the-counter melatonin which did not improve his symptoms. He sleeps the best when he is in his recliner. Epworth sleepiness score is 12 and he reports occasional naps about 1-2 hours on the weekends on his recliner. Bedtime is between 8 and 9 PM, sleep latency can be up to an hour, he sleeps on his back with one pillow in spite of 6 nocturnal awakenings including nocturia and is out of bed by 6 AM feeling tired with occasional headaches and dryness of mouth.  He has gained 27 pounds over the last 2 years There is no history suggestive of cataplexy, sleep paralysis or parasomnias   History has OSA and is on a CPAP machine. He lives at home with his wife and 2 boys age 10 and 81. He works at [employer] is striving time is about 30 minutes and he denies any problems driving  PS G [id number] showed moderate OSA with AHI overall 17/hour worse in supine position, AHI while supine was 34/hour lowest desaturation was 90%, supine sleep was noted for 44%, PLM index was 8.6/hour   No past medical history on file.  Past Surgical History:  Procedure Laterality Date  . APPENDECTOMY    . COLONOSCOPY N/A 01/09/2013   [id number] internal hemorrhoids and anal papilla/normal  . HAND SURGERY    . MANDIBLE FRACTURE SURGERY    . WISDOM TOOTH EXTRACTION       No Known Allergies   Social History   Social History  . Marital status: Married    Spouse name: N/A  . Number of children: 1  .  Years of education: N/A   Occupational History  . [employer]   Social History Main Topics  . Smoking status: Never Smoker  . Smokeless tobacco: Never Used  . Alcohol use Yes     Comment: socially, couple of times per month  . Drug use: No  . Sexual activity: Yes   Other Topics Concern  . Not on file   Social History Narrative  . No narrative on file     Family History  Problem Relation Age of Onset  . Diverticulitis Maternal Grandfather   . Hyperthyroidism Sister   . Hypothyroidism Sister      Review of Systems Constitutional: negative for anorexia, fevers and sweats  Eyes: negative for irritation, redness and visual disturbance  Ears, nose, mouth, throat, and face: negative for earaches, epistaxis, nasal congestion and sore throat  Respiratory: negative for cough, dyspnea on exertion, sputum and wheezing  Cardiovascular: negative for chest pain, dyspnea, lower extremity edema, orthopnea, palpitations and syncope  Gastrointestinal: negative for abdominal pain, constipation, diarrhea, melena, nausea and vomiting  Genitourinary:negative for dysuria, frequency and hematuria  Hematologic/lymphatic: negative for bleeding, easy bruising and lymphadenopathy  Musculoskeletal:negative for arthralgias, muscle weakness and stiff joints  Neurological: negative for coordination problems, gait problems, headaches and weakness  Endocrine: negative for diabetic symptoms  including polydipsia, polyuria and weight loss     Objective:   Physical Exam  Gen. Pleasant, obese, in no distress, normal affect ENT - no lesions, no post nasal drip, class 2 airway Neck: No JVD, no thyromegaly, no carotid bruits Lungs: no use of accessory muscles, no dullness to percussion, decreased without rales or rhonchi  Cardiovascular: Rhythm regular, heart sounds  normal, no murmurs or gallops, no peripheral edema Abdomen: soft and non-tender, no hepatosplenomegaly, BS  normal. Musculoskeletal: No deformities, no cyanosis or clubbing Neuro:  alert, non focal, no tremors       Assessment & Plan:

## 2017-01-26 ENCOUNTER — Ambulatory Visit: Payer: 59 | Attending: Pulmonary Disease | Admitting: Pulmonary Disease

## 2017-01-26 DIAGNOSIS — G4733 Obstructive sleep apnea (adult) (pediatric): Secondary | ICD-10-CM | POA: Diagnosis not present

## 2017-02-01 ENCOUNTER — Telehealth: Payer: Self-pay | Admitting: Pulmonary Disease

## 2017-02-01 DIAGNOSIS — G4730 Sleep apnea, unspecified: Secondary | ICD-10-CM | POA: Diagnosis not present

## 2017-02-01 DIAGNOSIS — G4733 Obstructive sleep apnea (adult) (pediatric): Secondary | ICD-10-CM

## 2017-02-01 NOTE — Telephone Encounter (Signed)
Pl send Rx for   CPAP  7 cm H2O with a Medium size Resmed Full Face Mask AirFit F20 mask and heated humidification DL in 4 wks OV with TP/ me in 6 wks

## 2017-02-01 NOTE — Procedures (Signed)
Patient Name: Earl [name], Earl [name] Study Date: 01/26/2017 Gender: Male D.O.B: 1986/10/03 Age (years): 30 Referring Provider: Cyril Mourning MD, ABSM Height (inches): 73 Interpreting Physician: Cyril Mourning MD, ABSM Weight (lbs): 238 RPSGT: Peak, Robert BMI: 31 MRN: [id number] Neck Size: 16.50   CLINICAL INFORMATION The patient is referred for a CPAP titration to treat sleep apnea.  Date of NPSG: 10/2016 , moderate AHI 17/h  SLEEP STUDY TECHNIQUE As per the AASM Manual for the Scoring of Sleep and Associated Events v2.3 (April 2016) with a hypopnea requiring 4% desaturations.  The channels recorded and monitored were frontal, central and occipital EEG, electrooculogram (EOG), submentalis EMG (chin), nasal and oral airflow, thoracic and abdominal wall motion, anterior tibialis EMG, snore microphone, electrocardiogram, and pulse oximetry. Continuous positive airway pressure (CPAP) was initiated at the beginning of the study and titrated to treat sleep-disordered breathing.  RESPIRATORY PARAMETERS Optimal PAP Pressure (cm): 7 AHI at Optimal Pressure (/hr): 0.0 Overall Minimal O2 (%): 94.00 Supine % at Optimal Pressure (%): 100 Minimal O2 at Optimal Pressure (%): 95.0     SLEEP ARCHITECTURE The study was initiated at 10:40:29 PM and ended at 4:43:38 AM.  Sleep onset time was 52.6 minutes and the sleep efficiency was 73.3%. The total sleep time was 266.0 minutes.  The patient spent 11.65% of the night in stage N1 sleep, 68.23% in stage N2 sleep, 0.00% in stage N3 and 20.11% in REM.Stage REM latency was 128.5 minutes  Wake after sleep onset was 44.5. Alpha intrusion was absent. Supine sleep was 52.64%.  CARDIAC DATA The 2 lead EKG demonstrated sinus rhythm. The mean heart rate was 62.70 beats per minute. Other EKG findings include: None.   LEG MOVEMENT DATA The total Periodic Limb Movements of Sleep (PLMS) were 0. The PLMS index was 0.00. A PLMS index of <15 is considered normal in  adults.  IMPRESSIONS - The optimal PAP pressure was 7 cm of water. - Central sleep apnea was not noted during this titration (CAI = 0.0/h). - Significant oxygen desaturations were not observed during this titration (min O2 = 94.00%). - The patient snored with Soft snoring volume during this titration study. - No cardiac abnormalities were observed during this study. - Clinically significant periodic limb movements were not noted during this study. Arousals associated with PLMs were rare.   DIAGNOSIS - Obstructive Sleep Apnea (327.23 [G47.33 ICD-10])   RECOMMENDATIONS - Trial of CPAP therapy on 7 cm H2O with a Medium size Resmed Full Face Mask AirFit F20 mask and heated humidification. - Avoid alcohol, sedatives and other CNS depressants that may worsen sleep apnea and disrupt normal sleep architecture. - Sleep hygiene should be reviewed to assess factors that may improve sleep quality. - Weight management and regular exercise should be initiated or continued. - Return to Sleep Center for re-evaluation after 4 weeks of therapy    Cyril Mourning MD Board Certified in Sleep medicine

## 2017-02-09 ENCOUNTER — Ambulatory Visit: Payer: 59 | Admitting: Acute Care

## 2017-11-21 ENCOUNTER — Telehealth: Payer: Self-pay | Admitting: Pulmonary Disease

## 2017-11-21 DIAGNOSIS — G4733 Obstructive sleep apnea (adult) (pediatric): Secondary | ICD-10-CM

## 2017-11-21 NOTE — Telephone Encounter (Signed)
Called spoke with patient Verified that he is [facility]  Advised patient that he is overdue for appt >> order was sent 4.26.18 for new CPAP start with ov in 6 weeks, no appt was scheduled  Appt scheduled with TP for 2.28.19 @ 0915 - pt advised to keep this appt  Order to [facility] to renew supplies with understanding that pt keeps his appt

## 2017-12-07 ENCOUNTER — Encounter: Payer: Self-pay | Admitting: Adult Health

## 2017-12-07 ENCOUNTER — Ambulatory Visit (INDEPENDENT_AMBULATORY_CARE_PROVIDER_SITE_OTHER): Payer: BLUE CROSS/BLUE SHIELD | Admitting: Adult Health

## 2017-12-07 DIAGNOSIS — G4733 Obstructive sleep apnea (adult) (pediatric): Secondary | ICD-10-CM

## 2017-12-07 NOTE — Addendum Note (Signed)
Addended by: Boone MasterJONES, JESSICA E on: 12/07/2017 09:32 AM   Modules accepted: Orders

## 2017-12-07 NOTE — Progress Notes (Signed)
 @Patient  ID: Earl [name], male    DOB: 01-Jan-1986, 32 y.o.   MRN: [id number]  Chief Complaint  Patient presents with  . Follow-up    OSA    Referring provider: Carylon PerchesFagan, Roy, MD  HPI: 32 year old male for sleep apnea  TEST  PS G 10/2016 showed moderate OSA with AHI overall 17/hour worse in supine position, AHI while supine was 34/hour lowest desaturation was 90%, supine sleep was noted for 44%, PLM index was 8.6/hour   12/07/2017 Follow up : OSA  Patient presents for a one year follow-up for sleep apnea.  Patient is on CPAP at bedtime.  Patient says he feels rested with no significant daytime sleepiness.  Download shows 60% usage.  Patient's using 7 hours each night.  Patient is on CPAP 7 cm H2O.  AHI 0.6.  Minimum leaks. Patient said he had a few nights he could not wear it because he had a recurrent sinus infection.   No Known Allergies  Immunization History  Administered Date(s) Administered  . Influenza Split 12/04/2017    History reviewed. No pertinent past medical history.  Tobacco History: Social History   Tobacco Use  Smoking Status Never Smoker  Smokeless Tobacco Never Used   Counseling given: Not Answered   No outpatient encounter medications on file as of 12/07/2017.   No facility-administered encounter medications on file as of 12/07/2017.      Review of Systems  Constitutional:   No  weight loss, night sweats,  Fevers, chills, fatigue, or  lassitude.  HEENT:   No headaches,  Difficulty swallowing,  Tooth/dental problems, or  Sore throat,                No sneezing, itching, ear ache, nasal congestion, post nasal drip,   CV:  No chest pain,  Orthopnea, PND, swelling in lower extremities, anasarca, dizziness, palpitations, syncope.   GI  No heartburn, indigestion, abdominal pain, nausea, vomiting, diarrhea, change in bowel habits, loss of appetite, bloody stools.   Resp: No shortness of breath with exertion or at rest.  No excess mucus, no  productive cough,  No non-productive cough,  No coughing up of blood.  No change in color of mucus.  No wheezing.  No chest wall deformity  Skin: no rash or lesions.  GU: no dysuria, change in color of urine, no urgency or frequency.  No flank pain, no hematuria   MS:  No joint pain or swelling.  No decreased range of motion.  No back pain.    Physical Exam  BP 118/80 (BP Location: Left Arm, Cuff Size: Normal)   Pulse 88   Ht 6\' 1"  (1.854 m)   Wt 241 lb (109.3 kg)   SpO2 98%   BMI 31.80 kg/m   GEN: A/Ox3; pleasant , NAD, obesity    HEENT:  Kramer/AT,  EACs-clear, TMs-wnl, NOSE-clear, THROAT-clear, no lesions, no postnasal drip or exudate noted. Class 2-3 MP   NECK:  Supple w/ fair ROM; no JVD; normal carotid impulses w/o bruits; no thyromegaly or nodules palpated; no lymphadenopathy.    RESP  Clear  P & A; w/o, wheezes/ rales/ or rhonchi. no accessory muscle use, no dullness to percussion  CARD:  RRR, no m/r/g, no peripheral edema, pulses intact, no cyanosis or clubbing.  GI:   Soft & nt; nml bowel sounds; no organomegaly or masses detected.   Musco: Warm bil, no deformities or joint swelling noted.   Neuro: alert, no focal deficits noted.  Skin: Warm, no lesions or rashes    Lab Results:  CBC    Component Value Date/Time   WBC 8.0 05/22/2012 1914   RBC 4.64 05/22/2012 1914   HGB 13.4 05/22/2012 1914   HCT 39.4 05/22/2012 1914   PLT 216 05/22/2012 1914   MCV 84.9 05/22/2012 1914   MCH 28.9 05/22/2012 1914   MCHC 34.0 05/22/2012 1914   RDW 12.8 05/22/2012 1914   LYMPHSABS 1.3 05/22/2012 1914   MONOABS 0.9 05/22/2012 1914   EOSABS 0.0 05/22/2012 1914   BASOSABS 0.0 05/22/2012 1914    BMET No results found for: NA, K, CL, CO2, GLUCOSE, BUN, CREATININE, CALCIUM, GFRNONAA, GFRAA  BNP No results found for: BNP  ProBNP No results found for: PROBNP  Imaging: No results found.   Assessment & Plan:   No problem-specific Assessment & Plan notes found for  this encounter.     Rubye Oaks, NP 12/07/2017

## 2017-12-07 NOTE — Assessment & Plan Note (Signed)
Wt loss  

## 2017-12-07 NOTE — Patient Instructions (Signed)
Continue on CPAP at bedtime Try to wear each night for at least 4-6 hours Do not drive a sleepy Saline nasal spray as needed Follow-up in 1 year with Dr. Vassie LollAlva  And As needed

## 2017-12-07 NOTE — Assessment & Plan Note (Signed)
Controlled on CPAP  Compliance encouarged  Plan  Patient Instructions  Continue on CPAP at bedtime Try to wear each night for at least 4-6 hours Do not drive a sleepy Saline nasal spray as needed Follow-up in 1 year with Dr. Vassie LollAlva  And As needed

## 2018-01-08 ENCOUNTER — Telehealth: Payer: Self-pay | Admitting: Pulmonary Disease

## 2018-01-08 NOTE — Telephone Encounter (Signed)
Have printed a download from [employer] and placed it in Dr. Reginia NaasAlva's lookat folder.  Will route this encounter to Dr. Vassie LollAlva for him to follow up on.

## 2018-01-09 NOTE — Telephone Encounter (Signed)
etter has been typed and printed. Called and spoke with Selena BattenKim from [employer]. Faxed letter to number [phone]  Nothing further needed.

## 2018-01-09 NOTE — Telephone Encounter (Signed)
CPAP download was reviewed for 12/2017 which shows excellent compliance and good control of events on CPAP 7 cm with minimal leak Okay to provide such a letter or documentation to DME

## 2018-01-09 NOTE — Progress Notes (Signed)
Letter written per RA.

## 2018-01-16 ENCOUNTER — Ambulatory Visit (INDEPENDENT_AMBULATORY_CARE_PROVIDER_SITE_OTHER): Payer: BLUE CROSS/BLUE SHIELD | Admitting: Pulmonary Disease

## 2018-01-16 ENCOUNTER — Encounter: Payer: Self-pay | Admitting: Pulmonary Disease

## 2018-01-16 DIAGNOSIS — G4733 Obstructive sleep apnea (adult) (pediatric): Secondary | ICD-10-CM | POA: Diagnosis not present

## 2018-01-16 NOTE — Progress Notes (Signed)
   Subjective:    Patient ID: Earl [name], male    DOB: September 07, 1986, 32 y.o.   MRN: [id number]  HPI  Chief Complaint  Patient presents with  . Follow-up    Follow up for OSA. Patient switched insurance companies and needs proof that he is using the machine in order for them to pay for supplies. [employer] [facility].    He was set up with CPAP 01/2017 with medium full facemask and this seems to have worked well.  He has noted significant improvement in his daytime somnolence and fatigue.  He has now started on exercise program is running in the morning. He developed a sinus infection and she thought was related to water and use humidifier.  His father helped him clean his machine out and since then he has not had any further issues.  He has changed insurance and needs recertification. His DME is [employer]. His weight is mostly unchanged.  CPAP download was reviewed which shows excellent compliance more than 7 hours per night with no residual events and minimal leak    Significant tests/ events reviewed  PSG1/2018 showed moderate OSA with AHI overall 17/hour worse in supine position, AHI while supine was 34/hour lowest desaturation was 90%,supine sleep was noted for 44%,PLM index was 8.6/hour  Review of Systems Patient denies significant dyspnea,cough, hemoptysis,  chest pain, palpitations, pedal edema, orthopnea, paroxysmal nocturnal dyspnea, lightheadedness, nausea, vomiting, abdominal or  leg pains      Objective:   Physical Exam Gen. Pleasant, obese, in no distress ENT - no lesions, no post nasal drip Neck: No JVD, no thyromegaly, no carotid bruits Lungs: no use of accessory muscles, no dullness to percussion, decreased without rales or rhonchi  Cardiovascular: Rhythm regular, heart sounds  normal, no murmurs or gallops, no peripheral edema Musculoskeletal: No deformities, no cyanosis or clubbing , no tremors        Assessment & Plan:

## 2018-01-16 NOTE — Patient Instructions (Signed)
CPAP is set at 7 cm and seems to be working well CPAP supplies will be renewed for a year 

## 2018-01-16 NOTE — Assessment & Plan Note (Signed)
CPAP is set at 7 cm and seems to be working well. This is certainly helped improve his daytime somnolence and fatigue and improved his energy levels to the point where he has started exercising CPAP supplies will be renewed for a year  Weight loss encouraged, compliance with goal of at least 4-6 hrs every night is the expectation. Advised against medications with sedative side effects Cautioned against driving when sleepy - understanding that sleepiness will vary on a day to day basis

## 2018-01-16 NOTE — Assessment & Plan Note (Signed)
Weight loss goal of 20 pounds 2-15 pounds feels sad.  If he reaches this level we will repeat home sleep study

## 2018-11-02 ENCOUNTER — Ambulatory Visit (INDEPENDENT_AMBULATORY_CARE_PROVIDER_SITE_OTHER): Payer: BLUE CROSS/BLUE SHIELD | Admitting: Pulmonary Disease

## 2018-11-02 ENCOUNTER — Encounter: Payer: Self-pay | Admitting: Pulmonary Disease

## 2018-11-02 DIAGNOSIS — G4733 Obstructive sleep apnea (adult) (pediatric): Secondary | ICD-10-CM | POA: Diagnosis not present

## 2018-11-02 NOTE — Patient Instructions (Signed)
Congratulations on your weight loss!Marland Kitchen. CPAP is set at 7 cm and is working well. Call me around late November and we can schedule home sleep study to reassess

## 2018-11-02 NOTE — Assessment & Plan Note (Signed)
CPAP is set at 7 cm and seems to be working well. He is compliant and this is certainly helped improve his daytime somnolence and fatigue. We discussed different types of full facemask. CPAP supplies will be renewed for a year

## 2018-11-02 NOTE — Progress Notes (Signed)
   Subjective:    Patient ID: [place] T Vowels, male    DOB: 1986-07-02, 33 y.o.   MRN: [id number]  HPI 33 year old man for follow-up of OSA, worse in the supine position  Chief Complaint  Patient presents with  . Follow-up    F/U for CPAP. States mask is not fitting correctly on right side, has developed an itchy spot.     He has done well, is very compliant with his machine.  This is helped improve his daytime somnolence, denies sleep pressure in the morning.  He has started running 30 minutes about twice a day about to 6 miles per day. Feels rested when he wakes up in the morning. Lately has developed a sore spot on his face, has settled down with his full facemask.  CPAP download was reviewed which shows excellent control of events and 7 cm, no leak and great compliance about 8 hours per night and no missed nights. He has lost 24 pounds to his current weight of 224 Significant tests/ events reviewed  PSG1/2018 showed moderate OSA with AHI overall 17/hour worse in supine position, AHI while supine was 34/hour lowest desaturation was 90%,supine sleep was noted for 44%,PLM index was 8.6/hour  Review of Systems Patient denies significant dyspnea,cough, hemoptysis,  chest pain, palpitations, pedal edema, orthopnea, paroxysmal nocturnal dyspnea, lightheadedness, nausea, vomiting, abdominal or  leg pains      Objective:   Physical Exam  Gen. Pleasant, well-nourished, in no distress ENT - no thrush, no pallor/icterus,no post nasal drip Neck: No JVD, no thyromegaly, no carotid bruits Lungs: no use of accessory muscles, no dullness to percussion, clear without rales or rhonchi  Cardiovascular: Rhythm regular, heart sounds  normal, no murmurs or gallops, no peripheral edema Musculoskeletal: No deformities, no cyanosis or clubbing        Assessment & Plan:

## 2018-11-02 NOTE — Assessment & Plan Note (Signed)
He has lost 24 pounds and I congratulated him on this. If he is able to keep the weight off by the end of the year, we will consider repeating another home sleep study

## 2019-09-11 ENCOUNTER — Telehealth: Payer: Self-pay | Admitting: Pulmonary Disease

## 2019-09-11 DIAGNOSIS — G4733 Obstructive sleep apnea (adult) (pediatric): Secondary | ICD-10-CM

## 2019-09-11 NOTE — Telephone Encounter (Signed)
HST order has not been placed.  Per AVS from 11/02/18 visit with RA -                 Call me around late November and we can schedule home sleep study to reassess

## 2019-09-11 NOTE — Telephone Encounter (Signed)
Spoke to pt he is aware of the backlog for hst and will get called as soon as we can Joellen Jersey

## 2019-09-11 NOTE — Telephone Encounter (Signed)
OK- I have placed the order  Please call pt to schedule, thanks!

## 2019-11-22 ENCOUNTER — Telehealth: Payer: Self-pay | Admitting: Pulmonary Disease

## 2019-11-22 NOTE — Telephone Encounter (Signed)
Spoke to pt he has had an insurance change he gave me the info I will precert it and we will call my back to set up time for him to pick up sleep machine Earl [name]

## 2019-11-22 NOTE — Telephone Encounter (Signed)
Checking on this HST and insurance precert Tobe Sos

## 2019-11-25 ENCOUNTER — Other Ambulatory Visit: Payer: Self-pay

## 2019-11-25 DIAGNOSIS — G4733 Obstructive sleep apnea (adult) (pediatric): Secondary | ICD-10-CM

## 2019-12-02 DIAGNOSIS — G4733 Obstructive sleep apnea (adult) (pediatric): Secondary | ICD-10-CM

## 2019-12-10 ENCOUNTER — Telehealth: Payer: Self-pay | Admitting: General Surgery

## 2019-12-10 NOTE — Telephone Encounter (Signed)
Called the patient and scheduled for video visit on 12/12/19 with Elisha Headland, NP.

## 2019-12-12 ENCOUNTER — Encounter: Payer: Self-pay | Admitting: Pulmonary Disease

## 2019-12-12 ENCOUNTER — Telehealth (INDEPENDENT_AMBULATORY_CARE_PROVIDER_SITE_OTHER): Payer: BLUE CROSS/BLUE SHIELD | Admitting: Pulmonary Disease

## 2019-12-12 DIAGNOSIS — G4733 Obstructive sleep apnea (adult) (pediatric): Secondary | ICD-10-CM | POA: Diagnosis not present

## 2019-12-12 NOTE — Assessment & Plan Note (Signed)
Recent home sleep study reviewed with patient.  Recent home sleep study negative for obstructive sleep apnea after weight loss.  Plan: Trial off of CPAP therapy Notify our office if daytime sleepiness, somnolence, apneas occur  Can follow-up with our office as needed.  Continue to work aggressively on losing weight, exercising regularly.

## 2019-12-12 NOTE — Progress Notes (Signed)
Virtual Visit via Video Note  I connected with Earl [name] on 12/12/19 at  2:30 PM EST by a video enabled telemedicine application and verified that I am speaking with the correct person using two identifiers.  Location: Patient: Home Provider: Office - [facility] - [address] [place], [address], [place], [place] [address]  I discussed the limitations of evaluation and management by telemedicine and the availability of in person appointments. The patient expressed understanding and agreed to proceed. I also discussed with the patient that there may be a patient responsible charge related to this service. The patient expressed understanding and agreed to proceed.  Patient consented to consult via telephone: Yes People present and their role in pt care: Pt   History of Present Illness:  34 year old male never smoker followed in our office for obstructive sleep apnea  Past medical history: Obesity Smoking history: Never smoker Maintenance: None [name]  Chief complaint: Review home sleep study  34 year old male never smoker.  Patient was previously diagnosed with obstructive sleep apnea.  Patient has worked hard by increasing his physical activity.  He is recently lost around 24 pounds.  Patient's home sleep study that he completed on 11/25/2019.  11/25/2019-home sleep study-AHI 4.5, SaO2 low 88%    Observations/Objective:  Social History   Tobacco Use  Smoking Status Never Smoker  Smokeless Tobacco Never Used   Immunization History  Administered Date(s) Administered  . Influenza Split 12/04/2017    Assessment and Plan:  OSA (obstructive sleep apnea) Recent home sleep study reviewed with patient.  Recent home sleep study negative for obstructive sleep apnea after weight loss.  Plan: Trial off of CPAP therapy Notify our office if daytime sleepiness, somnolence, apneas occur  Can follow-up with our office as needed.  Continue to work aggressively on  losing weight, exercising regularly.   Follow Up Instructions:  Return if symptoms worsen or fail to improve, for Follow up with Dr. Vassie Loll.    I discussed the assessment and treatment plan with the patient. The patient was provided an opportunity to ask questions and all were answered. The patient agreed with the plan and demonstrated an understanding of the instructions.   The patient was advised to call back or seek an in-person evaluation if the symptoms worsen or if the condition fails to improve as anticipated.  I provided 12 minutes of non-face-to-face time during this encounter.   Coral Ceo, NP

## 2019-12-12 NOTE — Patient Instructions (Addendum)
You were seen today by Coral Ceo, NP  for:   It was nice talking to you today.  Congratulations on your recent weight loss.  Keep up the hard work!  Your latest home sleep study showed a negative test for obstructive sleep apnea.  This is likely due to your weight loss.  Okay to trial coming off of CPAP therapy.  If you notice that you start having increased daytime sleepiness or fatigue please contact our office and resume CPAP therapy in the meantime.  If you have increased weight gain, relapses or have concerns that you may be having episodes of of sleep apnea please follow back up with our office.  Arlys John  1. OSA (obstructive sleep apnea)  Your recent weight loss has improved the control of obstructive sleep apnea.  Your recent home sleep study was negative for obstructive sleep apnea.  Okay to trial off of CPAP at this point in time  If you start noticing increased daytime fatigue, sleepiness please contact our office  Follow Up:    Return if symptoms worsen or fail to improve, for Follow up with Dr. Vassie Loll.   Please do your part to reduce the spread of COVID-19:      Reduce your risk of any infection  and COVID19 by using the similar precautions used for avoiding the common cold or flu:  Marland Kitchen Wash your hands often with soap and warm water for at least 20 seconds.  If soap and water are not readily available, use an alcohol-based hand sanitizer with at least 60% alcohol.  . If coughing or sneezing, cover your mouth and nose by coughing or sneezing into the elbow areas of your shirt or coat, into a tissue or into your sleeve (not your hands). Drinda Butts A MASK when in public  . Avoid shaking hands with others and consider head nods or verbal greetings only. . Avoid touching your eyes, nose, or mouth with unwashed hands.  . Avoid close contact with people who are sick. . Avoid places or events with large numbers of people in one location, like concerts or sporting events. . If you have  some symptoms but not all symptoms, continue to monitor at home and seek medical attention if your symptoms worsen. . If you are having a medical emergency, call 911.   ADDITIONAL HEALTHCARE OPTIONS FOR PATIENTS  [facility] / e-Visit: [email or web address]         [facility]: [phone]  [facility]: [phone]                   [facility]: [phone]     It is flu season:   >>> Best ways to protect herself from the flu: Receive the yearly flu vaccine, practice good hand hygiene washing with soap and also using hand sanitizer when available, eat a nutritious meals, get adequate rest, hydrate appropriately   Please contact the office if your symptoms worsen or you have concerns that you are not improving.   Thank you for choosing [facility] for your healthcare, and for allowing [place] to partner with you on your healthcare journey. I am thankful to be able to provide care to you today.   Elisha Headland FNP-C

## 2019-12-31 ENCOUNTER — Other Ambulatory Visit: Payer: Self-pay

## 2019-12-31 ENCOUNTER — Other Ambulatory Visit (HOSPITAL_COMMUNITY): Payer: Self-pay | Admitting: Internal Medicine

## 2019-12-31 ENCOUNTER — Encounter (HOSPITAL_COMMUNITY): Payer: Self-pay

## 2019-12-31 ENCOUNTER — Ambulatory Visit (HOSPITAL_COMMUNITY)
Admission: RE | Admit: 2019-12-31 | Discharge: 2019-12-31 | Disposition: A | Discharge status: Home / Self Care | Payer: BLUE CROSS/BLUE SHIELD | Source: Ambulatory Visit | Attending: Internal Medicine | Admitting: Internal Medicine

## 2019-12-31 DIAGNOSIS — M25551 Pain in right hip: Secondary | ICD-10-CM

## 2020-06-14 IMAGING — DX DG HIP (WITH OR WITHOUT PELVIS) 2-3V*R*
3 series · 3 of 3 positions shown · non-contrast
Comparison: None.

CLINICAL DATA: Right hip pain

EXAM:
DG HIP (WITH OR WITHOUT PELVIS) 2-3V RIGHT

[pelvis ap]
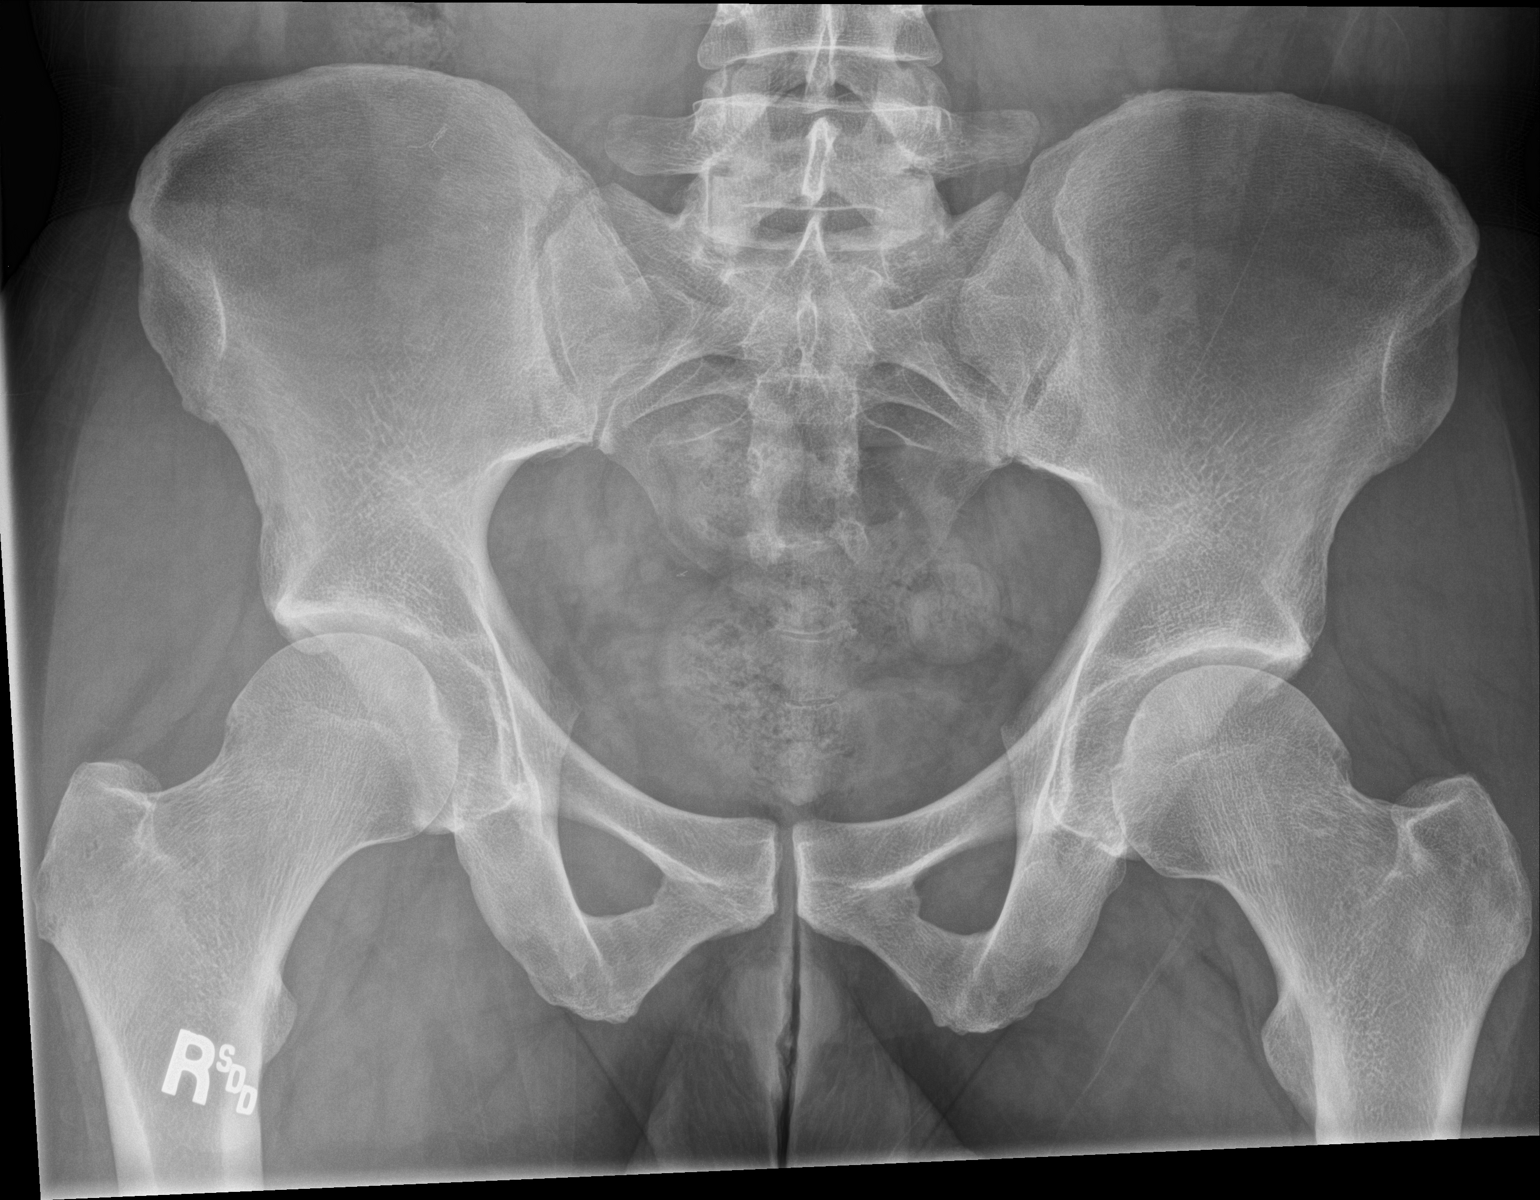

[hip ap]
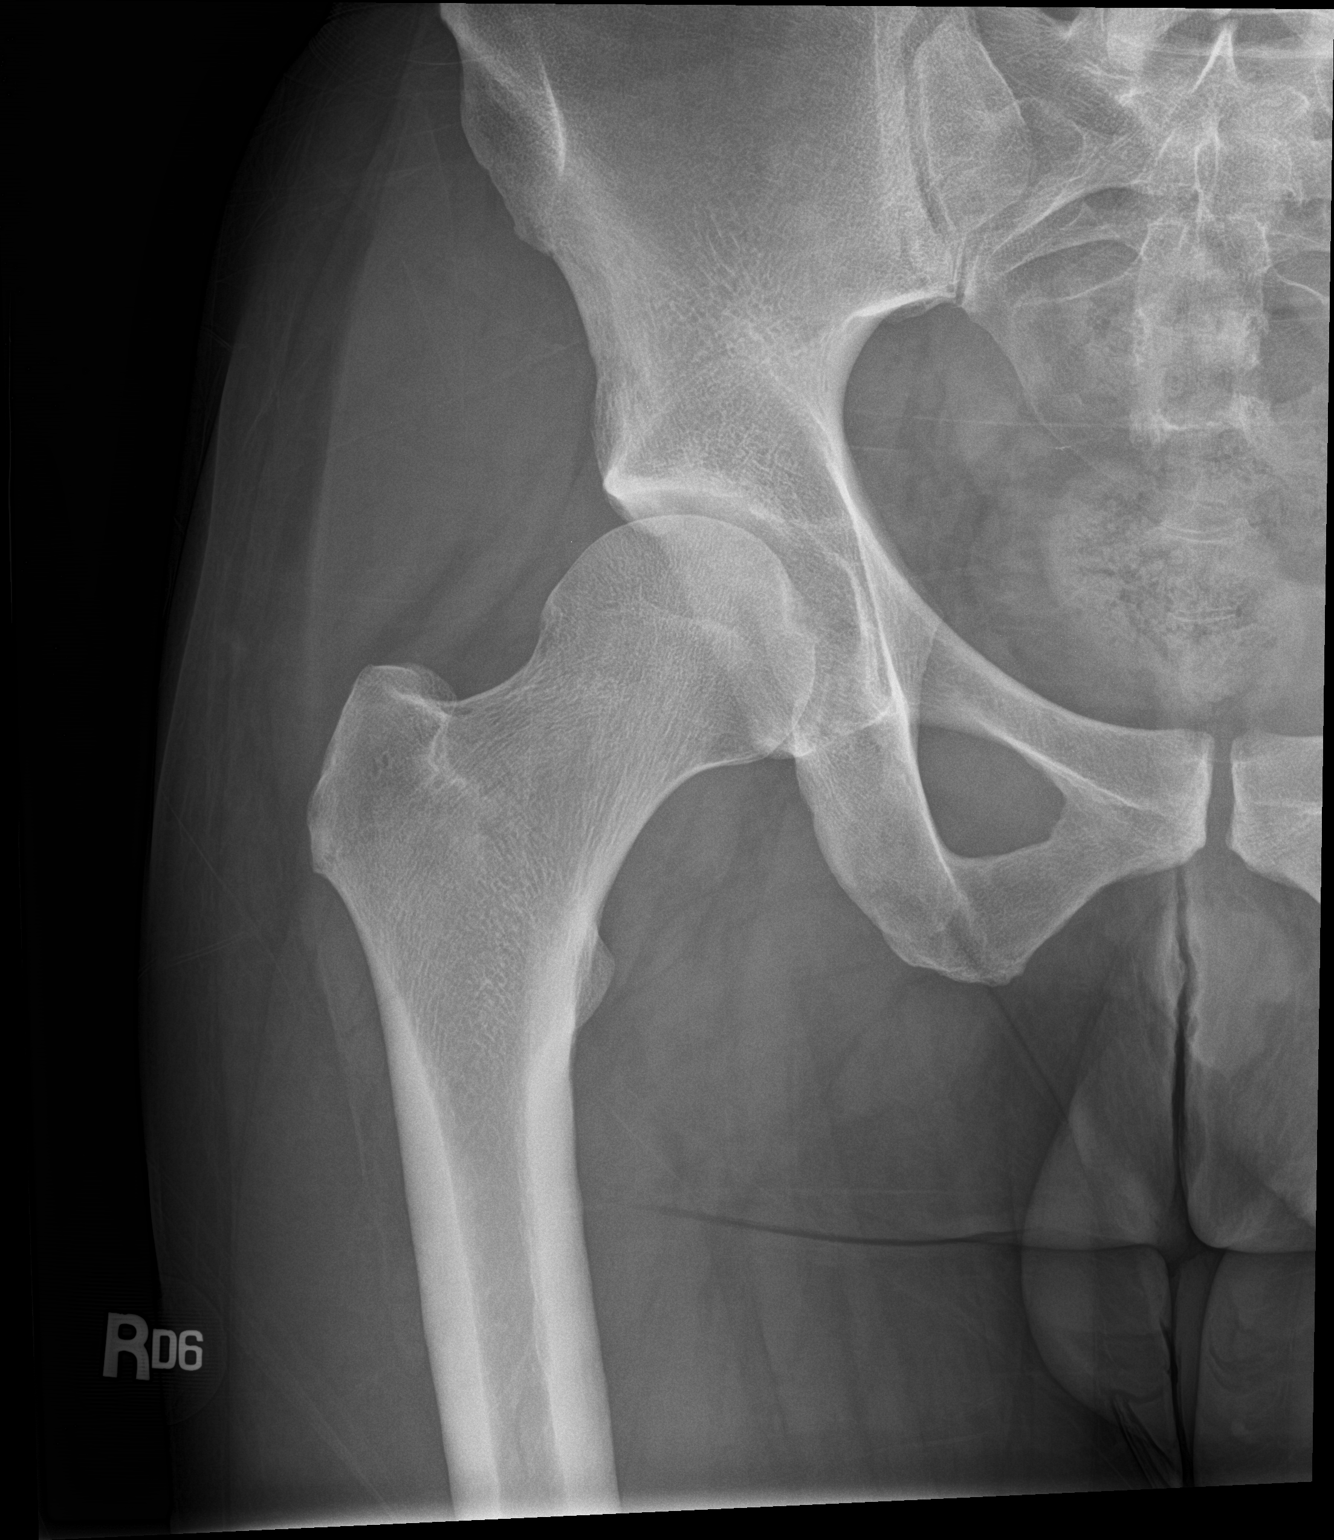

[hip lat]
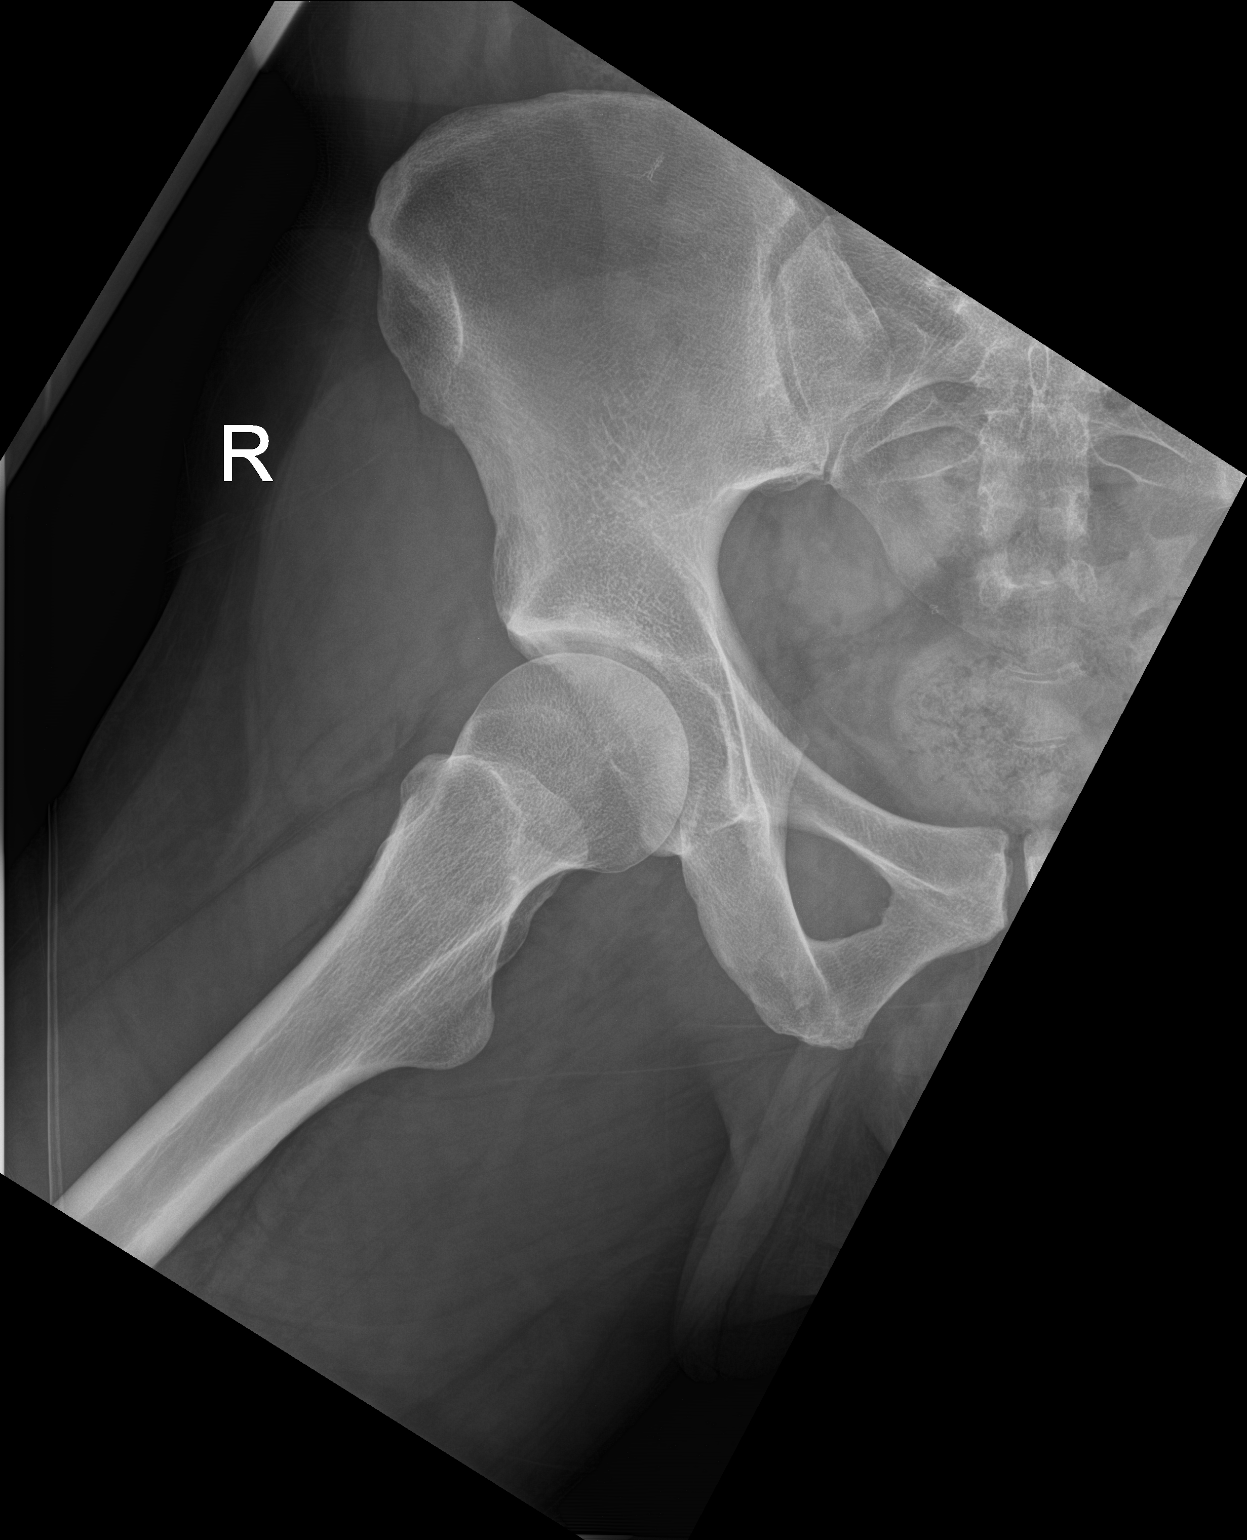

[3 of 3 positions shown; findings below may reference images not displayed]

FINDINGS: There is no evidence of hip fracture or dislocation. There is no
evidence of arthropathy or other focal bone abnormality.
IMPRESSION: Negative.

## 2020-10-22 ENCOUNTER — Other Ambulatory Visit: Payer: 59

## 2020-10-22 DIAGNOSIS — Z20822 Contact with and (suspected) exposure to covid-19: Secondary | ICD-10-CM

## 2020-10-25 LAB — NOVEL CORONAVIRUS, NAA: SARS-CoV-2, NAA: DETECTED — AB

## 2022-09-22 ENCOUNTER — Other Ambulatory Visit (HOSPITAL_COMMUNITY): Payer: Self-pay

## 2022-09-22 MED ORDER — WEGOVY 0.25 MG/0.5ML ~~LOC~~ SOAJ
SUBCUTANEOUS | 0 refills | Status: AC
  Filled 2022-09-22 – 2022-10-26 (×3): qty 2, 28d supply, fill #0

## 2022-09-22 MED ORDER — SAXENDA 18 MG/3ML ~~LOC~~ SOPN
PEN_INJECTOR | SUBCUTANEOUS | 2 refills | Status: AC
  Filled 2022-09-22 (×4): qty 6, 25d supply, fill #0

## 2022-09-30 ENCOUNTER — Other Ambulatory Visit (HOSPITAL_COMMUNITY): Payer: Self-pay

## 2022-10-12 ENCOUNTER — Other Ambulatory Visit (HOSPITAL_COMMUNITY): Payer: Self-pay

## 2022-10-18 ENCOUNTER — Other Ambulatory Visit (HOSPITAL_COMMUNITY): Payer: Self-pay

## 2022-10-24 ENCOUNTER — Other Ambulatory Visit (HOSPITAL_COMMUNITY): Payer: Self-pay

## 2022-10-26 ENCOUNTER — Other Ambulatory Visit (HOSPITAL_BASED_OUTPATIENT_CLINIC_OR_DEPARTMENT_OTHER): Payer: Self-pay

## 2022-10-26 ENCOUNTER — Other Ambulatory Visit (HOSPITAL_COMMUNITY): Payer: Self-pay

## 2022-10-26 ENCOUNTER — Other Ambulatory Visit: Payer: Self-pay

## 2022-11-15 ENCOUNTER — Other Ambulatory Visit (HOSPITAL_COMMUNITY): Payer: Self-pay

## 2022-11-21 DIAGNOSIS — S161XXA Strain of muscle, fascia and tendon at neck level, initial encounter: Secondary | ICD-10-CM | POA: Diagnosis not present

## 2022-11-22 ENCOUNTER — Other Ambulatory Visit (HOSPITAL_COMMUNITY): Payer: Self-pay

## 2022-11-23 ENCOUNTER — Other Ambulatory Visit (HOSPITAL_COMMUNITY): Payer: Self-pay

## 2022-11-25 ENCOUNTER — Other Ambulatory Visit (HOSPITAL_COMMUNITY): Payer: Self-pay

## 2022-11-25 MED ORDER — OZEMPIC (0.25 OR 0.5 MG/DOSE) 2 MG/3ML ~~LOC~~ SOPN
0.2500 mg | PEN_INJECTOR | SUBCUTANEOUS | 0 refills | Status: AC
  Filled 2022-11-25 – 2023-10-10 (×3): qty 3, 28d supply, fill #0

## 2022-12-01 ENCOUNTER — Other Ambulatory Visit (HOSPITAL_COMMUNITY): Payer: Self-pay

## 2022-12-05 ENCOUNTER — Other Ambulatory Visit (HOSPITAL_COMMUNITY): Payer: Self-pay

## 2022-12-06 ENCOUNTER — Other Ambulatory Visit (HOSPITAL_COMMUNITY): Payer: Self-pay

## 2022-12-07 ENCOUNTER — Other Ambulatory Visit (HOSPITAL_COMMUNITY): Payer: Self-pay

## 2023-01-27 DIAGNOSIS — E669 Obesity, unspecified: Secondary | ICD-10-CM | POA: Diagnosis not present

## 2023-01-27 DIAGNOSIS — R5383 Other fatigue: Secondary | ICD-10-CM | POA: Diagnosis not present

## 2023-02-09 DIAGNOSIS — E6609 Other obesity due to excess calories: Secondary | ICD-10-CM | POA: Diagnosis not present

## 2023-02-09 DIAGNOSIS — R5383 Other fatigue: Secondary | ICD-10-CM | POA: Diagnosis not present

## 2023-05-01 ENCOUNTER — Other Ambulatory Visit (HOSPITAL_COMMUNITY): Payer: Self-pay

## 2023-09-13 ENCOUNTER — Other Ambulatory Visit (HOSPITAL_COMMUNITY): Payer: Self-pay

## 2023-09-13 MED ORDER — AZITHROMYCIN 250 MG PO TABS
ORAL_TABLET | ORAL | 0 refills | Status: AC
  Filled 2023-09-13: qty 6, 5d supply, fill #0

## 2023-09-14 ENCOUNTER — Other Ambulatory Visit: Payer: Self-pay

## 2023-09-14 ENCOUNTER — Ambulatory Visit
Admission: RE | Admit: 2023-09-14 | Discharge: 2023-09-14 | Disposition: A | Discharge status: Home / Self Care | Payer: BC Managed Care – PPO | Source: Ambulatory Visit | Attending: Family Medicine | Admitting: Family Medicine

## 2023-09-14 VITALS — BP 114/86 | HR 87 | Temp 98.1°F | Resp 19

## 2023-09-14 DIAGNOSIS — J208 Acute bronchitis due to other specified organisms: Secondary | ICD-10-CM | POA: Diagnosis not present

## 2023-09-14 MED ORDER — DEXAMETHASONE SODIUM PHOSPHATE 10 MG/ML IJ SOLN
10.0000 mg | Freq: Once | INTRAMUSCULAR | Status: AC
  Administered 2023-09-14: 10 mg via INTRAMUSCULAR

## 2023-09-14 MED ORDER — ALBUTEROL SULFATE (2.5 MG/3ML) 0.083% IN NEBU
2.5000 mg | INHALATION_SOLUTION | Freq: Once | RESPIRATORY_TRACT | Status: AC
  Administered 2023-09-14: 2.5 mg via RESPIRATORY_TRACT

## 2023-09-14 MED ORDER — ALBUTEROL SULFATE HFA 108 (90 BASE) MCG/ACT IN AERS: 2.0000 | INHALATION_SPRAY | RESPIRATORY_TRACT | 0 refills | Status: AC | PRN

## 2023-09-14 MED ORDER — PROMETHAZINE-DM 6.25-15 MG/5ML PO SYRP: 5.0000 mL | ORAL_SOLUTION | Freq: Four times a day (QID) | ORAL | 0 refills | Status: AC | PRN

## 2023-09-14 NOTE — ED Triage Notes (Addendum)
Pt reports fever, chills, diarrhea since monday. Reports was negative with home covid test. Called pcp and started on z-pack yesterday reports improvement in symptoms but reports bilateral rib pain remains with worsening cough.  Pt reports spouse was seen yesterday for similar and reports tested negative for covid and flu.

## 2023-09-14 NOTE — Discharge Instructions (Signed)
Have given you a nebulizer treatment and a steroid shot today in clinic which should hopefully start helping tremendously with her symptoms.  Continue the over-the-counter cold and congestion medications and Mucinex, stay well-hydrated, make sure to be taking deep breaths.  I have sent in a good cough syrup and inhaler to be used as needed additionally.

## 2023-09-14 NOTE — ED Provider Notes (Signed)
[facility]    CSN: [id number] Arrival date & time: 09/14/23  1032      History   Chief Complaint Chief Complaint  Patient presents with   Cough    Fever since early Monday. Wife and kids tested negative for flu and Covid. - Entered by patient    HPI Earl [name] is a 37 y.o. male.   Patient presenting today with about 5 days of fever, chills, diarrhea, vomiting, cough.  Denies chest pain, shortness of breath, abdominal pain, intolerance to p.o. at this time.  States he called his primary care yesterday who called in a Z-Pak "for the fever and cough" but was not evaluated in person and he thinks this is helping but still having chest tightness, shortness of breath and anterior rib pain bilaterally with deep breaths.  Taking numerous over-the-counter cold and congestion medications with minimal relief.  No known history of chronic pulmonary disease.  Spouse was seen yesterday and tested negative for COVID and flu and his home COVID test was negative.    History reviewed. No pertinent past medical history.  Patient Active Problem List   Diagnosis Date Noted   Morbid obesity (HCC) 12/07/2017   OSA (obstructive sleep apnea) 01/20/2017   Anorectal fissure 02/06/2013   Hemorrhoids 02/06/2013   Rectal pain 01/01/2013   Rectal bleeding 01/01/2013   Bowel habit changes 01/01/2013    Past Surgical History:  Procedure Laterality Date   APPENDECTOMY     COLONOSCOPY N/A 01/09/2013   WUJ:[id number] internal hemorrhoids and anal papilla/normal   HAND SURGERY     MANDIBLE FRACTURE SURGERY     WISDOM TOOTH EXTRACTION         Home Medications    Prior to Admission medications   Medication Sig Start Date End Date Taking? Authorizing Provider  albuterol (VENTOLIN HFA) 108 (90 Base) MCG/ACT inhaler Inhale 2 puffs into the lungs every 4 (four) hours as needed. 09/14/23  Yes Particia Nearing, PA-C  promethazine-dextromethorphan (PROMETHAZINE-DM) 6.25-15 MG/5ML syrup  Take 5 mLs by mouth 4 (four) times daily as needed. 09/14/23  Yes Particia Nearing, PA-C  azithromycin (ZITHROMAX Z-PAK) 250 MG tablet Take 2 tablets (500 mg total) by mouth daily for 1 day, THEN 1 tablet (250 mg total) daily for 4 days. 09/13/23 09/18/23    Semaglutide,0.25 or 0.5MG /DOS, (OZEMPIC, 0.25 OR 0.5 MG/DOSE,) 2 MG/3ML SOPN Inject 0.25 mg into the skin every 7 (seven) days for 4 weeks 11/25/22     Semaglutide-Weight Management (WEGOVY) 0.25 MG/0.5ML SOAJ Inject 0.25mg  under the skin every week as directed 09/22/22       Family History Family History  Problem Relation Age of Onset   Diverticulitis Maternal Grandfather    Hyperthyroidism Sister    Hypothyroidism Sister     Social History Social History   Tobacco Use   Smoking status: Never   Smokeless tobacco: Never  Substance Use Topics   Alcohol use: Yes    Comment: socially, couple of times per month   Drug use: No     Allergies   Patient has no known allergies.   Review of Systems Review of Systems Per HPI  Physical Exam Triage Vital Signs ED Triage Vitals  Encounter Vitals Group     BP 09/14/23 1041 114/86     Systolic BP Percentile --      Diastolic BP Percentile --      Pulse Rate 09/14/23 1041 87     Resp 09/14/23 1041 19  Temp 09/14/23 1041 98.1 F (36.7 C)     Temp Source 09/14/23 1041 Oral     SpO2 09/14/23 1041 98 %     Weight --      Height --      Head Circumference --      Peak Flow --      Pain Score 09/14/23 1045 6     Pain Loc --      Pain Education --      Exclude from Growth Chart --    No data found.  Updated Vital Signs BP 114/86 (BP Location: Right Arm)   Pulse 87   Temp 98.1 F (36.7 C) (Oral)   Resp 19   SpO2 98%   Visual Acuity Right Eye Distance:   Left Eye Distance:   Bilateral Distance:    Right Eye Near:   Left Eye Near:    Bilateral Near:     Physical Exam Vitals and nursing note reviewed.  Constitutional:      Appearance: He is well-developed.   HENT:     Head: Atraumatic.     Right Ear: External ear normal.     Left Ear: External ear normal.     Nose: Rhinorrhea present.     Mouth/Throat:     Pharynx: Posterior oropharyngeal erythema present. No oropharyngeal exudate.  Eyes:     Conjunctiva/sclera: Conjunctivae normal.     Pupils: Pupils are equal, round, and reactive to light.  Cardiovascular:     Rate and Rhythm: Normal rate and regular rhythm.  Pulmonary:     Effort: Pulmonary effort is normal. No respiratory distress.     Breath sounds: Wheezing present. No rales.  Musculoskeletal:        General: Normal range of motion.     Cervical back: Normal range of motion and neck supple.  Lymphadenopathy:     Cervical: No cervical adenopathy.  Skin:    General: Skin is warm and dry.  Neurological:     Mental Status: He is alert and oriented to person, place, and time.  Psychiatric:        Behavior: Behavior normal.      UC Treatments / Results  Labs (all labs ordered are listed, but only abnormal results are displayed) Labs Reviewed - No data to display  EKG   Radiology No results found.  Procedures Procedures (including critical care time)  Medications Ordered in UC Medications  dexamethasone (DECADRON) injection 10 mg (10 mg Intramuscular Given 09/14/23 1118)  albuterol (PROVENTIL) (2.5 MG/3ML) 0.083% nebulizer solution 2.5 mg (2.5 mg Nebulization Given 09/14/23 1118)    Initial Impression / Assessment and Plan / UC Course  I have reviewed the triage vital signs and the nursing notes.  Pertinent labs & imaging results that were available during my care of the patient were reviewed by me and considered in my medical decision making (see chart for details).     Vital signs within normal limits today and he is overall well-appearing in no acute distress.  He does have moderate wheezes on exam but no crackles.  Given IM Decadron and an albuterol nebulizer treatment in clinic.  Discussed treatment with  albuterol inhaler as needed, Phenergan DM, supportive the counter medications and home care additionally.  Return for worsening symptoms.  Final Clinical Impressions(s) / UC Diagnoses   Final diagnoses:  Viral bronchitis     Discharge Instructions      Have given you a nebulizer treatment and a steroid shot  today in clinic which should hopefully start helping tremendously with her symptoms.  Continue the over-the-counter cold and congestion medications and Mucinex, stay well-hydrated, make sure to be taking deep breaths.  I have sent in a good cough syrup and inhaler to be used as needed additionally.   ED Prescriptions     Medication Sig Dispense Auth. Provider   albuterol (VENTOLIN HFA) 108 (90 Base) MCG/ACT inhaler Inhale 2 puffs into the lungs every 4 (four) hours as needed. 18 g Roosvelt Maser [place], [place]   promethazine-dextromethorphan (PROMETHAZINE-DM) 6.25-15 MG/5ML syrup Take 5 mLs by mouth 4 (four) times daily as needed. 100 mL Particia Nearing, [place]      PDMP not reviewed this encounter.   Particia Nearing, [place] 09/14/23 1155

## 2023-10-04 ENCOUNTER — Other Ambulatory Visit: Payer: Self-pay | Admitting: Family Medicine

## 2023-10-10 ENCOUNTER — Other Ambulatory Visit (HOSPITAL_COMMUNITY): Payer: Self-pay
